# Patient Record
Sex: Female | Born: 1935 | Race: White | Hispanic: No | Marital: Married | Smoking: Former smoker
Health system: Southern US, Community
[De-identification: ages and names within clinical notes are randomized; demographics above are authoritative.]

## PROBLEM LIST (undated history)

## (undated) DIAGNOSIS — E785 Hyperlipidemia, unspecified: Secondary | ICD-10-CM

## (undated) DIAGNOSIS — N183 Chronic kidney disease, stage 3 unspecified: Secondary | ICD-10-CM

## (undated) DIAGNOSIS — I10 Essential (primary) hypertension: Secondary | ICD-10-CM

## (undated) DIAGNOSIS — R05 Cough: Secondary | ICD-10-CM

## (undated) DIAGNOSIS — R053 Chronic cough: Secondary | ICD-10-CM

## (undated) DIAGNOSIS — K220 Achalasia of cardia: Secondary | ICD-10-CM

## (undated) HISTORY — DX: Achalasia of cardia: K22.0

## (undated) HISTORY — DX: Essential (primary) hypertension: I10

## (undated) HISTORY — DX: Hyperlipidemia, unspecified: E78.5

## (undated) HISTORY — PX: ESOPHAGUS SURGERY: SHX626

---

## 2010-05-27 DIAGNOSIS — I10 Essential (primary) hypertension: Secondary | ICD-10-CM | POA: Insufficient documentation

## 2010-05-27 DIAGNOSIS — E7800 Pure hypercholesterolemia, unspecified: Secondary | ICD-10-CM

## 2010-05-30 ENCOUNTER — Ambulatory Visit: Payer: Self-pay | Admitting: Pulmonary Disease

## 2010-05-30 DIAGNOSIS — R05 Cough: Secondary | ICD-10-CM

## 2010-05-30 DIAGNOSIS — J309 Allergic rhinitis, unspecified: Secondary | ICD-10-CM | POA: Insufficient documentation

## 2010-06-01 ENCOUNTER — Telehealth: Payer: Self-pay | Admitting: Pulmonary Disease

## 2010-06-01 DIAGNOSIS — R930 Abnormal findings on diagnostic imaging of skull and head, not elsewhere classified: Secondary | ICD-10-CM | POA: Insufficient documentation

## 2010-06-27 ENCOUNTER — Ambulatory Visit: Payer: Self-pay | Admitting: Cardiology

## 2010-06-30 ENCOUNTER — Encounter: Payer: Self-pay | Admitting: Pulmonary Disease

## 2010-07-04 ENCOUNTER — Telehealth (INDEPENDENT_AMBULATORY_CARE_PROVIDER_SITE_OTHER): Payer: Self-pay | Admitting: *Deleted

## 2010-08-01 ENCOUNTER — Ambulatory Visit (HOSPITAL_COMMUNITY): Admission: RE | Admit: 2010-08-01 | Discharge: 2010-08-01 | Payer: Self-pay | Admitting: Gastroenterology

## 2010-08-08 ENCOUNTER — Encounter: Admission: RE | Admit: 2010-08-08 | Discharge: 2010-08-08 | Payer: Self-pay | Admitting: Gastroenterology

## 2010-09-12 ENCOUNTER — Ambulatory Visit (HOSPITAL_COMMUNITY): Admission: RE | Admit: 2010-09-12 | Discharge: 2010-09-13 | Payer: Self-pay | Admitting: General Surgery

## 2011-01-10 NOTE — Assessment & Plan Note (Signed)
Summary: consult for chronic cough   Copy to:  Benedetto Goad Primary Provider/Referring Provider:  Benedetto Goad  CC:  Pulmonary Consult.  History of Present Illness: the pt is a 77y/o female who I have been asked to see for chronic cough.  She states this has been an ongoing issue for over one year, and a cough syrup is the only thing that helps it.  She has also been treated with abx and for GERD,with no relief.  She describes a classic globus sensation in her throat, and her husband states that she clears her throat constantly.  She has signficant postnasal drip, and hoarseness at times.  She denies any sinus pressure or h/o recurrent sinusitis.  She has occasional reflux symptoms, but not on a consistent basis.  Her cough is worse with prolonged conversation, but better with lozenges.  She has no h/o childhood asthma, and no consistent breathing issues.  She has not had a recent cxr.  Preventive Screening-Counseling & Management  Alcohol-Tobacco     Smoking Status: quit  Medications Prior to Update: 1)  Travatan Z 0.004 % Soln (Travoprost) .Marland Kitchen.. 1 Drop in Both Eyes At Bedtime 2)  Pravastatin Sodium 40 Mg Tabs (Pravastatin Sodium) .... Take 1 Tablet By Mouth Once A Day 3)  Diltiazem Hcl Coated Beads 240 Mg Xr24h-Cap (Diltiazem Hcl Coated Beads) .... Take 1 Tablet By Mouth Once A Day 4)  Diovan Hct 160-25 Mg Tabs (Valsartan-Hydrochlorothiazide) .... Take 1 Tablet By Mouth Once A Day  Current Medications (verified): 1)  Travatan Z 0.004 % Soln (Travoprost) .Marland Kitchen.. 1 Drop in Both Eyes At Bedtime 2)  Pravastatin Sodium 40 Mg Tabs (Pravastatin Sodium) .... Take 1 Tablet By Mouth Once A Day 3)  Diltiazem Hcl Coated Beads 240 Mg Xr24h-Cap (Diltiazem Hcl Coated Beads) .... Take 1 Tablet By Mouth Once A Day 4)  Diovan Hct 160-25 Mg Tabs (Valsartan-Hydrochlorothiazide) .... Take 1 Tablet By Mouth Once A Day 5)  Hydrocodone-Homatropine 5-1.5 Mg/74ml Syrp (Hydrocodone-Homatropine) .... Use As Needed For  Cough  Allergies (verified): 1)  ! Doxycycline 2)  ! Lipitor 3)  ! * Metoprolol 4)  ! Niacin 5)  ! Penicillin 6)  ! Sulfa  Past History:  Past Medical History:  ALLERGIC RHINITIS (ICD-477.9) HYPERTENSION (ICD-401.9) HYPERCHOLESTEROLEMIA (ICD-272.0)    Past Surgical History: hysterectomy 1984  Family History: Reviewed history and no changes required. heart disease: father, brother cancer: sister (bone)  Social History: Reviewed history and no changes required. Patient states former smoker.  started at age 42.  1 ppd.  quit 2008. pt is married to [place] and lives with him. Pt has children. pt is retired. prev worked in [employer].  Smoking Status:  quit  Review of Systems       The patient complains of shortness of breath with activity, productive cough, and sneezing.  The patient denies shortness of breath at rest, non-productive cough, coughing up blood, chest pain, irregular heartbeats, acid heartburn, indigestion, loss of appetite, weight change, abdominal pain, difficulty swallowing, sore throat, tooth/dental problems, headaches, nasal congestion/difficulty breathing through nose, itching, ear ache, anxiety, depression, hand/feet swelling, joint stiffness or pain, rash, change in color of mucus, and fever.    Vital Signs:  Patient profile:   77 year old female Height:      63 inches Weight:      165 pounds BMI:     29.33 O2 Sat:      97 % on Room air Temp:     98.1 degrees  F oral Pulse rate:   69 / minute BP sitting:   138 / 82  (left arm) Cuff size:   regular  Vitals Entered By: Arman Filter LPN (May 30, 2010 11:08 AM)  O2 Flow:  Room air CC: Pulmonary Consult Comments Medications reviewed with patient Arman Filter LPN  May 30, 2010 11:16 AM    Physical Exam  General:  ow female in nad Eyes:  PERRLA and EOMI.   Nose:  patent without discharge no purulence noted mild inflammatory changes. Mouth:  clear, no exudates Neck:  no jvd, tmg, LN Lungs:   totally clear to auscultation Heart:  rrr, no mrg Abdomen:  soft and nontender, bs+ Extremities:  no edema noted, +varicosities no cyanosis  Neurologic:  alert and oriented, moves all 4.   Impression & Recommendations:  Problem # 1:  COUGH, CHRONIC (ICD-786.2) the pt has a chronic cough for over one year duration that I suspect is upper airway in origin.  She has a classic globus sensation, hoarseness, and chronic throat clearing.  The most common causes are postnasal drip, LPR, and a cyclical cough mechanism.  The pt may have all 3.  Would like to work on behavioral therapies to prevent further propagation, and also to treat aggressively for AR and reflux.  Will also check cxr for completeness given her h/o tobacco abuse.  Medications Added to Medication List This Visit: 1)  Hydrocodone-homatropine 5-1.5 Mg/9ml Syrp (Hydrocodone-homatropine) .... Use as needed for cough  Other Orders: Consultation Level IV (36644) T-2 View CXR (71020TC)  Patient Instructions: 1)  continue your cough medicine everynight, and take as needed during the day as well.  The goal is to NOT COUGH. 2)  use hard candy to bathe back of throat all during the day 3)  no throat clearing,  just drink water. 4)  take dexilant one each am for reflux for the next 2 weeks 5)  take chlorpheniramine 8mg  over the counter each night for next 2 weeks 6)  take veramyst 2 sprays each nostril each am. 7)  limit voice use as much as possible. 8)  will check cxr today, and call you with results. 9)  call me in 2 weeks to let me know how things are going.

## 2011-01-10 NOTE — Progress Notes (Signed)
Summary: NEEDS TO SCHEDULE CT  Phone Note Call from Patient Call back at Home Phone [phone]   Caller: Patient Call For: [facility] Summary of Call: WANTS TO SCHEDULE HER CHEST CT AROUND THE 18 TH OF JULY 19, OR 20TH Initial call taken by: Lacinda Axon,  June 01, 2010 12:47 PM  Follow-up for Phone Call        Dr. Shelle Iron: please advise of specifics for pt's chest ct.  thanks! Boone Master CNA/MA  June 01, 2010 2:55 PM   Additional Follow-up for Phone Call Additional follow up Details #1::        will send order to [facility] Additional Follow-up by: Barbaraann Share MD,  June 01, 2010 5:25 PM  New Problems: ABNORMAL CHEST XRAY (ICD-793.1)   New Problems: ABNORMAL CHEST XRAY (ICD-793.1)

## 2011-01-10 NOTE — Letter (Signed)
Summary: Response to Benedetto Goad MD  Response to Benedetto Goad MD   Imported By: Lester Turkey 07/04/2010 08:41:18  _____________________________________________________________________  External Attachment:    Type:   Image     Comment:   External Document

## 2011-01-10 NOTE — Progress Notes (Signed)
Summary: returning call  Phone Note Call from Patient Call back at Home Phone [phone]   Caller: Patient Call For: clance Reason for Call: Talk to Nurse Summary of Call: returning call to Megan re: CT results. Initial call taken by: Eugene Gavia,  July 04, 2010 8:46 AM  Follow-up for Phone Call        called and spoke with pt.  pt aware of ct results.  Arman Filter LPN  July 04, 2010 2:31 PM

## 2011-02-23 LAB — CBC
HCT: 30.9 % — ABNORMAL LOW (ref 36.0–46.0)
HCT: 33.1 % — ABNORMAL LOW (ref 36.0–46.0)
Hemoglobin: 10.5 g/dL — ABNORMAL LOW (ref 12.0–15.0)
Hemoglobin: 11.6 g/dL — ABNORMAL LOW (ref 12.0–15.0)
MCH: 31.1 pg (ref 26.0–34.0)
MCH: 31.6 pg (ref 26.0–34.0)
MCHC: 34 g/dL (ref 30.0–36.0)
MCHC: 35 g/dL (ref 30.0–36.0)
MCV: 91.5 fL (ref 78.0–100.0)
MCV: 90.4 fL (ref 78.0–100.0)
Platelets: 211 10*3/uL (ref 150–400)
Platelets: 276 10*3/uL (ref 150–400)
RBC: 3.38 MIL/uL — ABNORMAL LOW (ref 3.87–5.11)
RBC: 3.66 MIL/uL — ABNORMAL LOW (ref 3.87–5.11)
RDW: 12.4 % (ref 11.5–15.5)
RDW: 13 % (ref 11.5–15.5)
WBC: 10.6 10*3/uL — ABNORMAL HIGH (ref 4.0–10.5)
WBC: 6.4 10*3/uL (ref 4.0–10.5)

## 2011-02-23 LAB — BASIC METABOLIC PANEL
BUN: 9 mg/dL (ref 6–23)
CO2: 25 mEq/L (ref 19–32)
Calcium: 8.7 mg/dL (ref 8.4–10.5)
Chloride: 99 mEq/L (ref 96–112)
Creatinine, Ser: 1.19 mg/dL (ref 0.4–1.2)
GFR calc Af Amer: 54 mL/min — ABNORMAL LOW (ref >60)
GFR calc non Af Amer: 44 mL/min — ABNORMAL LOW (ref >60)
Glucose, Bld: 147 mg/dL — ABNORMAL HIGH (ref 70–99)
Potassium: 3.9 mEq/L (ref 3.5–5.1)
Sodium: 130 mEq/L — ABNORMAL LOW (ref 135–145)

## 2011-02-23 LAB — ABO/RH: ABO/RH(D): O NEG

## 2011-02-23 LAB — SURGICAL PCR SCREEN
MRSA, PCR: NEGATIVE
Staphylococcus aureus: NEGATIVE

## 2011-02-23 LAB — DIFFERENTIAL
Basophils Absolute: 0 10*3/uL (ref 0.0–0.1)
Basophils Relative: 1 % (ref 0–1)
Eosinophils Absolute: 0 10*3/uL (ref 0.0–0.7)
Eosinophils Relative: 1 % (ref 0–5)
Lymphocytes Relative: 24 % (ref 12–46)
Lymphs Abs: 1.5 10*3/uL (ref 0.7–4.0)
Monocytes Absolute: 0.6 10*3/uL (ref 0.1–1.0)
Monocytes Relative: 9 % (ref 3–12)
Neutro Abs: 4.2 10*3/uL (ref 1.7–7.7)
Neutrophils Relative %: 67 % (ref 43–77)

## 2011-02-23 LAB — COMPREHENSIVE METABOLIC PANEL
ALT: 12 U/L (ref 0–35)
AST: 20 U/L (ref 0–37)
Albumin: 4.1 g/dL (ref 3.5–5.2)
Alkaline Phosphatase: 109 U/L (ref 39–117)
BUN: 8 mg/dL (ref 6–23)
CO2: 29 mEq/L (ref 19–32)
Calcium: 9.2 mg/dL (ref 8.4–10.5)
Chloride: 96 mEq/L (ref 96–112)
Creatinine, Ser: 1.34 mg/dL — ABNORMAL HIGH (ref 0.4–1.2)
GFR calc Af Amer: 47 mL/min — ABNORMAL LOW (ref >60)
GFR calc non Af Amer: 39 mL/min — ABNORMAL LOW (ref >60)
Glucose, Bld: 95 mg/dL (ref 70–99)
Potassium: 3.7 mEq/L (ref 3.5–5.1)
Sodium: 132 mEq/L — ABNORMAL LOW (ref 135–145)
Total Bilirubin: 0.9 mg/dL (ref 0.3–1.2)
Total Protein: 6.9 g/dL (ref 6.0–8.3)

## 2011-02-23 LAB — PROTIME-INR
INR: 0.94 (ref 0.00–1.49)
Prothrombin Time: 12.8 seconds (ref 11.6–15.2)

## 2011-02-23 LAB — TYPE AND SCREEN
ABO/RH(D): O NEG
Antibody Screen: NEGATIVE

## 2011-03-06 HISTORY — PX: ABDOMINAL HYSTERECTOMY: SHX81

## 2011-03-27 HISTORY — PX: CATARACT EXTRACTION: SUR2

## 2011-08-21 ENCOUNTER — Other Ambulatory Visit: Payer: Self-pay | Admitting: Orthopedic Surgery

## 2011-08-21 DIAGNOSIS — M545 Low back pain: Secondary | ICD-10-CM

## 2011-08-21 DIAGNOSIS — M25552 Pain in left hip: Secondary | ICD-10-CM

## 2011-08-28 ENCOUNTER — Ambulatory Visit
Admission: RE | Admit: 2011-08-28 | Discharge: 2011-08-28 | Disposition: A | Discharge status: Home / Self Care | Payer: Medicare Other | Source: Ambulatory Visit | Attending: Orthopedic Surgery | Admitting: Orthopedic Surgery

## 2011-08-28 DIAGNOSIS — M545 Low back pain: Secondary | ICD-10-CM

## 2011-08-28 DIAGNOSIS — M25552 Pain in left hip: Secondary | ICD-10-CM

## 2011-11-27 ENCOUNTER — Encounter (INDEPENDENT_AMBULATORY_CARE_PROVIDER_SITE_OTHER): Payer: Self-pay

## 2011-11-28 ENCOUNTER — Encounter (INDEPENDENT_AMBULATORY_CARE_PROVIDER_SITE_OTHER): Payer: Self-pay | Admitting: General Surgery

## 2011-11-28 ENCOUNTER — Ambulatory Visit (INDEPENDENT_AMBULATORY_CARE_PROVIDER_SITE_OTHER): Payer: Medicare Other | Admitting: General Surgery

## 2011-11-28 DIAGNOSIS — R1310 Dysphagia, unspecified: Secondary | ICD-10-CM | POA: Insufficient documentation

## 2011-11-28 NOTE — Patient Instructions (Signed)
Avoid bread products.  Take one Prilosec pin everyday at 5 pm.  We will call you with your test results.

## 2011-11-28 NOTE — Progress Notes (Signed)
Patient ID: Jean Lam, female   DOB: Mar 15, 1934, 77 y.o.   MRN: [id number]  Chief Complaint  Patient presents with  . Follow-up    LTF re ecal pt having feeling of food getting stuckl    HPI Jean Lam is a 77 y.o. female.   HPI  She is status post laparoscopic Heller myotomy for achalasia September 12, 2010. Ever since her diet was liberated she's been having some trouble swallowing bread products at times. There is no other specific foods that she has trouble with.  She does make herself regurgitate when this happens. She has occasional heartburn. She does have a cough.  Past Medical History  Diagnosis Date  . Hyperlipidemia, HTN, Achalasia, Pneumonia     Past Surgical History  Procedure Date  . Abdominal hysterectomy 03/06/11  . Cataract extraction 03/27/2011    Family History  Problem Relation Age of Onset  . Heart disease Father     Social History History  Substance Use Topics  . Smoking status: Former Games developer  . Smokeless tobacco: Not on file  . Alcohol Use: No    Allergies  Allergen Reactions  . Atorvastatin   . Doxycycline   . Metoprolol   . Niacin   . Penicillins   . Sulfonamide Derivatives     Current Outpatient Prescriptions  Medication Sig Dispense Refill  . diltiazem (CARDIZEM CD) 240 MG 24 hr capsule daily.      . meloxicam (MOBIC) 7.5 MG tablet daily.      . pravastatin (PRAVACHOL) 40 MG tablet daily.      Marland Kitchen spironolactone-hydrochlorothiazide (ALDACTAZIDE) 25-25 MG per tablet daily.      . TRAVATAN Z 0.004 % SOLN ophthalmic solution At bedtime.      . triamcinolone cream (KENALOG) 0.1 %         Review of Systems Review of Systems  Constitutional: Negative for fever and unexpected weight change.  Respiratory: Positive for cough.   Gastrointestinal:       No abd pain.  Occasional diarrhea.    Blood pressure 148/96, pulse 80, temperature 96.9 F (36.1 C), temperature source Temporal, resp. rate 20, height 5' 0.5" (1.537 m), weight 166 lb  6.4 oz (75.479 kg).  Physical Exam Physical Exam  Constitutional: No distress.       Overweight.   HENT:  Head: Normocephalic and atraumatic.  Mouth/Throat: Oropharynx is clear and moist.  Abdominal: Soft. She exhibits no distension and no mass. There is no tenderness.       Well healed scars.    Data Reviewed Operative Note.  Assessment    Dysphagia to bread products which has basically be occuring ever since her diet was advanced to as tolerated.  Also, has a cough- ? Reflux.    Plan    Avoid bread products.  UGI. Prilosec daily.  Will call her with the UGI results.       [name] J 11/28/2011, 10:44 AM

## 2011-12-14 ENCOUNTER — Ambulatory Visit (HOSPITAL_COMMUNITY)
Admission: RE | Admit: 2011-12-14 | Discharge: 2011-12-14 | Disposition: A | Discharge status: Home / Self Care | Payer: Medicare Other | Source: Ambulatory Visit | Attending: General Surgery | Admitting: General Surgery

## 2011-12-14 DIAGNOSIS — K449 Diaphragmatic hernia without obstruction or gangrene: Secondary | ICD-10-CM | POA: Insufficient documentation

## 2011-12-14 DIAGNOSIS — R1310 Dysphagia, unspecified: Secondary | ICD-10-CM | POA: Insufficient documentation

## 2011-12-14 DIAGNOSIS — R079 Chest pain, unspecified: Secondary | ICD-10-CM | POA: Insufficient documentation

## 2011-12-21 ENCOUNTER — Telehealth (INDEPENDENT_AMBULATORY_CARE_PROVIDER_SITE_OTHER): Payer: Self-pay | Admitting: General Surgery

## 2011-12-21 NOTE — Telephone Encounter (Signed)
I spoke with Mrs. Hinger regarding her upper GI study. It demonstrates significant esophageal motility disorder but contrast does pass into the stomach and a barium tablet does pass through although it is delayed. A small hiatal hernia is noted. There was no evidence of reflux. There is nothing here to suggest an etiology for cough. We discussed the fact that certain foods may still be difficult to swallow such as bread products and she should probably avoid those. Overall her swallowing is still better than it was preoperatively.

## 2012-03-06 ENCOUNTER — Institutional Professional Consult (permissible substitution): Payer: Medicare Other | Admitting: Pulmonary Disease

## 2013-03-24 ENCOUNTER — Encounter: Payer: Self-pay | Admitting: Pulmonary Disease

## 2013-03-24 ENCOUNTER — Ambulatory Visit (INDEPENDENT_AMBULATORY_CARE_PROVIDER_SITE_OTHER)
Admission: RE | Admit: 2013-03-24 | Discharge: 2013-03-24 | Disposition: A | Discharge status: Home / Self Care | Payer: Medicare Other | Source: Ambulatory Visit | Attending: Pulmonary Disease | Admitting: Pulmonary Disease

## 2013-03-24 ENCOUNTER — Ambulatory Visit (INDEPENDENT_AMBULATORY_CARE_PROVIDER_SITE_OTHER): Payer: Medicare Other | Admitting: Pulmonary Disease

## 2013-03-24 VITALS — BP 130/76 | HR 74 | Temp 98.3°F | Ht 63.0 in | Wt 165.0 lb

## 2013-03-24 DIAGNOSIS — R05 Cough: Secondary | ICD-10-CM

## 2013-03-24 MED ORDER — BENZONATATE 100 MG PO CAPS: 200.0000 mg | ORAL_CAPSULE | Freq: Four times a day (QID) | ORAL | Status: DC | PRN

## 2013-03-24 MED ORDER — OMEPRAZOLE 40 MG PO CPDR: 40.0000 mg | DELAYED_RELEASE_CAPSULE | Freq: Every day | ORAL | Status: DC

## 2013-03-24 NOTE — Assessment & Plan Note (Signed)
The patient has a chronic cough that is clearly coming from her upper airway.  She describes a tickle as well as chronic hoarseness.  She does have a long-standing history of smoking, and I cannot exclude the possibility of COPD.  We do need to do a followup chest x-ray to make sure there is no pulmonary pathology.  She has a history of achalasia, and has persistent dysphagia and regurgitation despite having a myotomy.  I suspect reflux is the culprit for her chronic cough, and we'll start her on a trial of b.i.d. Proton pump inhibitor.  She may need a trial of a promotility drug if this does not help.  Can also do PFT's for completeness if no improvement.

## 2013-03-24 NOTE — Progress Notes (Signed)
  Subjective:    Patient ID: Jean Lam, female    DOB: Aug 16, 1934, 79 y.o.   MRN: [id number]  HPI The patient comes in today for followup of chronic cough.  I have not seen her in almost 3 years, and she tells me that her cough never totally resolved.  The patient has a history of achalasia, and is status post myotomy in 2011.  She was noted in 2000 at 14 to have persistent dysphagia with regurgitation, as well as reflux symptoms.  She underwent an upper GI in 2012 which showed significant esophageal dysmotility.  She continues to have issues with dysphasia, regurgitation, and also reflux.  She is on no proton pump inhibitor at this time.  She feels the cough is coming from her throat area, and has a persistently hoarse voice.  She has significant postnasal drip, but has not had any purulence from her nasal passages.  She does have a long history of smoking, and has not had a recent chest x-ray.   Review of Systems  Constitutional: Negative for fever and unexpected weight change.  HENT: Positive for postnasal drip. Negative for ear pain, nosebleeds, congestion, sore throat, rhinorrhea, sneezing, trouble swallowing, dental problem and sinus pressure.   Eyes: Negative for redness and itching.  Respiratory: Positive for cough and shortness of breath. Negative for chest tightness and wheezing.        Patient reports a "tickle" in mid throat causing her to cough  Cardiovascular: Negative for palpitations and leg swelling.  Gastrointestinal: Negative for nausea and vomiting.  Genitourinary: Negative for dysuria.  Musculoskeletal: Negative for joint swelling.  Skin: Negative for rash.  Neurological: Negative for headaches.  Hematological: Does not bruise/bleed easily.  Psychiatric/Behavioral: Negative for dysphoric mood. The patient is not nervous/anxious.        Objective:   Physical Exam Well-developed female in no acute distress Nose without purulence or discharge noted Oropharynx  clear Neck without lymphadenopathy or thyromegaly Chest totally clear to auscultation, no wheezing Cardiac exam with regular rate and rhythm Lower extremities with minimal edema, no cyanosis Alert and oriented, moves all 4 extremities.       Assessment & Plan:

## 2013-03-24 NOTE — Patient Instructions (Addendum)
Will start on medication for acid reflux.  Take omeprazole 40mg  in am and pm for next 4 weeks. Get chlorpheniramine 4mg  otc, and take 2 at bedtime each night (have someone help you find at drugstore). Use hard candy (no mint or menthol) during the day to help cough Can use tessalon pearls 100mg  and take 2 every 6 hrs if needed for cough. Will check xray of chest today, and call with results.  followup with me in 4 weeks.

## 2013-04-22 ENCOUNTER — Ambulatory Visit (INDEPENDENT_AMBULATORY_CARE_PROVIDER_SITE_OTHER): Payer: Medicare Other | Admitting: Pulmonary Disease

## 2013-04-22 ENCOUNTER — Other Ambulatory Visit: Payer: Self-pay | Admitting: Pulmonary Disease

## 2013-04-22 ENCOUNTER — Encounter: Payer: Self-pay | Admitting: Pulmonary Disease

## 2013-04-22 VITALS — BP 142/82 | HR 67 | Temp 97.4°F | Ht 62.0 in | Wt 164.4 lb

## 2013-04-22 DIAGNOSIS — R05 Cough: Secondary | ICD-10-CM

## 2013-04-22 NOTE — Patient Instructions (Addendum)
Continue with chlorpheniramine 4mg  otc, 2 at bedtime and one at lunch as needed for your postnasal drip Stay on your acid reflux medication until you are seen by your GI doctor Will get you in to see Dr. Loreta Ave for further GI advice.  I see nothing from a lung standpoint to cause your cough, and I am concerned this is coming from reflux associated with your abnormal esophagus.

## 2013-04-22 NOTE — Assessment & Plan Note (Addendum)
The patient continues to have a chronic cough that I believe his upper airway in origin.  I really see no pulmonary explanation for her ongoing cough.  Her spirometry today is totally normal.  She does have some postnasal drip, and I've encouraged her to take her antihistamine on a consistent basis for this.  However, she also has severe esophageal dysmotility, and is at risk for significant reflux disease.  I will therefore send her to her gastroenterologist for further advice.

## 2013-04-22 NOTE — Progress Notes (Signed)
  Subjective:    Patient ID: Jean Lam, female    DOB: 1934/06/30, 79 y.o.   MRN: [id number]  HPI Patient comes in today for followup of her chronic cough.  At the last visit, her cough was felt to be secondary to reflux disease associated with her achalasia and esophageal dysmotility.  She was treated with a b.i.d. Proton pump inhibitor, and also asked to try chlorpheniramine for postnasal drip.  Also reviewed with her the behavioral therapies for cyclical coughing.  She comes in today where she feels her cough has not improved.  She continues to have a tickle in her throat, and thinks that she may have nasal drainage.  She denies sinus pressure, and has only clear nasal mucus.   Review of Systems  Constitutional: Negative for fever and unexpected weight change.  HENT: Positive for rhinorrhea, trouble swallowing ( choking with eating some foods. ) and postnasal drip. Negative for ear pain, nosebleeds, congestion, sore throat, sneezing, dental problem and sinus pressure.   Eyes: Negative for redness and itching.  Respiratory: Positive for cough and shortness of breath. Negative for chest tightness and wheezing.   Cardiovascular: Negative for palpitations and leg swelling.  Gastrointestinal: Positive for vomiting ( with coughing spells). Negative for nausea.  Genitourinary: Negative for dysuria.  Musculoskeletal: Negative for joint swelling.  Skin: Negative for rash.  Neurological: Negative for headaches.  Hematological: Does not bruise/bleed easily.  Psychiatric/Behavioral: Negative for dysphoric mood. The patient is not nervous/anxious.        Objective:   Physical Exam Overweight female in no acute distress Nose without purulence or discharge noted Oropharynx clear Neck without lymphadenopathy or thyromegaly Chest totally clear to auscultation, no wheezes or rhonchi Cardiac exam is regular rate and rhythm, 2/6 systolic murmur Lower extremities with mild ankle edema, no  cyanosis Alert and oriented, moves all 4 extremities.       Assessment & Plan:

## 2013-06-11 ENCOUNTER — Other Ambulatory Visit: Payer: Self-pay | Admitting: Gastroenterology

## 2013-06-11 DIAGNOSIS — R1310 Dysphagia, unspecified: Secondary | ICD-10-CM

## 2013-06-24 ENCOUNTER — Ambulatory Visit
Admission: RE | Admit: 2013-06-24 | Discharge: 2013-06-24 | Disposition: A | Discharge status: Home / Self Care | Payer: Medicare Other | Source: Ambulatory Visit | Attending: Gastroenterology | Admitting: Gastroenterology

## 2013-06-24 DIAGNOSIS — R1310 Dysphagia, unspecified: Secondary | ICD-10-CM

## 2015-09-30 ENCOUNTER — Encounter (HOSPITAL_COMMUNITY): Payer: Self-pay

## 2015-09-30 ENCOUNTER — Inpatient Hospital Stay (HOSPITAL_COMMUNITY)
Admission: EM | Admit: 2015-09-30 | Discharge: 2015-10-03 | DRG: 391 | Disposition: A | Discharge status: Home / Self Care | Payer: Medicare Other | Attending: Internal Medicine | Admitting: Internal Medicine

## 2015-09-30 ENCOUNTER — Emergency Department (HOSPITAL_COMMUNITY): Payer: Medicare Other

## 2015-09-30 DIAGNOSIS — R05 Cough: Secondary | ICD-10-CM | POA: Diagnosis present

## 2015-09-30 DIAGNOSIS — R197 Diarrhea, unspecified: Secondary | ICD-10-CM | POA: Diagnosis present

## 2015-09-30 DIAGNOSIS — Z8249 Family history of ischemic heart disease and other diseases of the circulatory system: Secondary | ICD-10-CM

## 2015-09-30 DIAGNOSIS — N183 Chronic kidney disease, stage 3 (moderate): Secondary | ICD-10-CM | POA: Diagnosis present

## 2015-09-30 DIAGNOSIS — R748 Abnormal levels of other serum enzymes: Secondary | ICD-10-CM | POA: Diagnosis present

## 2015-09-30 DIAGNOSIS — Z881 Allergy status to other antibiotic agents status: Secondary | ICD-10-CM

## 2015-09-30 DIAGNOSIS — E871 Hypo-osmolality and hyponatremia: Secondary | ICD-10-CM | POA: Diagnosis present

## 2015-09-30 DIAGNOSIS — E785 Hyperlipidemia, unspecified: Secondary | ICD-10-CM | POA: Diagnosis present

## 2015-09-30 DIAGNOSIS — N390 Urinary tract infection, site not specified: Secondary | ICD-10-CM | POA: Diagnosis present

## 2015-09-30 DIAGNOSIS — K219 Gastro-esophageal reflux disease without esophagitis: Secondary | ICD-10-CM | POA: Diagnosis present

## 2015-09-30 DIAGNOSIS — I10 Essential (primary) hypertension: Secondary | ICD-10-CM | POA: Diagnosis not present

## 2015-09-30 DIAGNOSIS — K529 Noninfective gastroenteritis and colitis, unspecified: Principal | ICD-10-CM | POA: Diagnosis present

## 2015-09-30 DIAGNOSIS — E7800 Pure hypercholesterolemia, unspecified: Secondary | ICD-10-CM | POA: Diagnosis present

## 2015-09-30 DIAGNOSIS — Z87891 Personal history of nicotine dependence: Secondary | ICD-10-CM | POA: Diagnosis not present

## 2015-09-30 DIAGNOSIS — I129 Hypertensive chronic kidney disease with stage 1 through stage 4 chronic kidney disease, or unspecified chronic kidney disease: Secondary | ICD-10-CM | POA: Diagnosis present

## 2015-09-30 DIAGNOSIS — Z6824 Body mass index (BMI) 24.0-24.9, adult: Secondary | ICD-10-CM | POA: Diagnosis not present

## 2015-09-30 DIAGNOSIS — E860 Dehydration: Secondary | ICD-10-CM | POA: Diagnosis present

## 2015-09-30 DIAGNOSIS — E440 Moderate protein-calorie malnutrition: Secondary | ICD-10-CM | POA: Insufficient documentation

## 2015-09-30 DIAGNOSIS — N179 Acute kidney failure, unspecified: Secondary | ICD-10-CM | POA: Diagnosis present

## 2015-09-30 DIAGNOSIS — E876 Hypokalemia: Secondary | ICD-10-CM | POA: Diagnosis present

## 2015-09-30 DIAGNOSIS — R059 Cough, unspecified: Secondary | ICD-10-CM | POA: Diagnosis present

## 2015-09-30 DIAGNOSIS — K859 Acute pancreatitis without necrosis or infection, unspecified: Secondary | ICD-10-CM | POA: Diagnosis present

## 2015-09-30 HISTORY — DX: Chronic kidney disease, stage 3 unspecified: N18.30

## 2015-09-30 HISTORY — DX: Chronic cough: R05.3

## 2015-09-30 HISTORY — DX: Chronic kidney disease, stage 3 (moderate): N18.3

## 2015-09-30 HISTORY — DX: Cough: R05

## 2015-09-30 LAB — URINALYSIS, ROUTINE W REFLEX MICROSCOPIC
GLUCOSE, UA: NEGATIVE mg/dL
Hgb urine dipstick: NEGATIVE
KETONES UR: 15 mg/dL — AB
NITRITE: POSITIVE — AB
PROTEIN: 30 mg/dL — AB
Specific Gravity, Urine: 1.026 (ref 1.005–1.030)
Urobilinogen, UA: 1 mg/dL (ref 0.0–1.0)
pH: 5 (ref 5.0–8.0)

## 2015-09-30 LAB — CBC
HCT: 34.9 % — ABNORMAL LOW (ref 36.0–46.0)
Hemoglobin: 12.2 g/dL (ref 12.0–15.0)
MCH: 32.3 pg (ref 26.0–34.0)
MCHC: 35 g/dL (ref 30.0–36.0)
MCV: 92.3 fL (ref 78.0–100.0)
PLATELETS: 326 10*3/uL (ref 150–400)
RBC: 3.78 MIL/uL — AB (ref 3.87–5.11)
RDW: 12.9 % (ref 11.5–15.5)
WBC: 27.9 10*3/uL — AB (ref 4.0–10.5)

## 2015-09-30 LAB — COMPREHENSIVE METABOLIC PANEL
ALK PHOS: 89 U/L (ref 38–126)
ALT: 13 U/L — AB (ref 14–54)
AST: 29 U/L (ref 15–41)
Albumin: 3.6 g/dL (ref 3.5–5.0)
Anion gap: 15 (ref 5–15)
BILIRUBIN TOTAL: 1.3 mg/dL — AB (ref 0.3–1.2)
BUN: 25 mg/dL — AB (ref 6–20)
CALCIUM: 8.6 mg/dL — AB (ref 8.9–10.3)
CO2: 22 mmol/L (ref 22–32)
CREATININE: 2.78 mg/dL — AB (ref 0.44–1.00)
Chloride: 89 mmol/L — ABNORMAL LOW (ref 101–111)
GFR calc non Af Amer: 15 mL/min — ABNORMAL LOW (ref >60)
GFR, EST AFRICAN AMERICAN: 18 mL/min — AB (ref >60)
Glucose, Bld: 116 mg/dL — ABNORMAL HIGH (ref 65–99)
Potassium: 3.3 mmol/L — ABNORMAL LOW (ref 3.5–5.1)
Sodium: 126 mmol/L — ABNORMAL LOW (ref 135–145)
TOTAL PROTEIN: 7.2 g/dL (ref 6.5–8.1)

## 2015-09-30 LAB — URINE MICROSCOPIC-ADD ON

## 2015-09-30 LAB — LIPASE, BLOOD: LIPASE: 131 U/L — AB (ref 11–51)

## 2015-09-30 MED ORDER — METRONIDAZOLE IN NACL 5-0.79 MG/ML-% IV SOLN
500.0000 mg | Freq: Once | INTRAVENOUS | Status: AC
  Administered 2015-09-30: 500 mg via INTRAVENOUS
  Filled 2015-09-30: qty 100

## 2015-09-30 MED ORDER — DILTIAZEM HCL ER COATED BEADS 240 MG PO CP24
240.0000 mg | ORAL_CAPSULE | Freq: Every day | ORAL | Status: DC
  Administered 2015-10-01 – 2015-10-03 (×3): 240 mg via ORAL
  Filled 2015-09-30 (×3): qty 1

## 2015-09-30 MED ORDER — SODIUM CHLORIDE 0.9 % IV BOLUS (SEPSIS)
1000.0000 mL | Freq: Once | INTRAVENOUS | Status: AC
  Administered 2015-09-30: 1000 mL via INTRAVENOUS

## 2015-09-30 MED ORDER — VITAMIN D 1000 UNITS PO TABS
2000.0000 [IU] | ORAL_TABLET | Freq: Every day | ORAL | Status: DC
  Administered 2015-10-01 – 2015-10-03 (×3): 2000 [IU] via ORAL
  Filled 2015-09-30 (×3): qty 2

## 2015-09-30 MED ORDER — FAMOTIDINE IN NACL 20-0.9 MG/50ML-% IV SOLN
20.0000 mg | Freq: Two times a day (BID) | INTRAVENOUS | Status: DC
  Administered 2015-10-01 (×3): 20 mg via INTRAVENOUS
  Filled 2015-09-30 (×4): qty 50

## 2015-09-30 MED ORDER — POTASSIUM CHLORIDE 20 MEQ/15ML (10%) PO SOLN
20.0000 meq | Freq: Once | ORAL | Status: AC
  Administered 2015-10-01: 20 meq via ORAL
  Filled 2015-09-30: qty 15

## 2015-09-30 MED ORDER — SODIUM CHLORIDE 0.9 % IJ SOLN
3.0000 mL | Freq: Two times a day (BID) | INTRAMUSCULAR | Status: DC
  Administered 2015-10-01 – 2015-10-02 (×2): 3 mL via INTRAVENOUS

## 2015-09-30 MED ORDER — VITAMIN B-12 1000 MCG PO TABS
1000.0000 ug | ORAL_TABLET | Freq: Every day | ORAL | Status: DC
Start: 2015-10-01 — End: 2015-10-03
  Administered 2015-10-01 – 2015-10-03 (×3): 1000 ug via ORAL
  Filled 2015-09-30 (×3): qty 1

## 2015-09-30 MED ORDER — ONDANSETRON HCL 4 MG/2ML IJ SOLN: 4.0000 mg | Freq: Three times a day (TID) | INTRAMUSCULAR | Status: DC | PRN

## 2015-09-30 MED ORDER — BENZONATATE 100 MG PO CAPS: 200.0000 mg | ORAL_CAPSULE | Freq: Four times a day (QID) | ORAL | Status: DC | PRN

## 2015-09-30 MED ORDER — METRONIDAZOLE IN NACL 5-0.79 MG/ML-% IV SOLN
500.0000 mg | Freq: Three times a day (TID) | INTRAVENOUS | Status: DC
  Administered 2015-10-01 – 2015-10-02 (×4): 500 mg via INTRAVENOUS
  Filled 2015-09-30 (×5): qty 100

## 2015-09-30 MED ORDER — SODIUM CHLORIDE 0.9 % IV SOLN
INTRAVENOUS | Status: DC
  Administered 2015-10-01 – 2015-10-02 (×4): via INTRAVENOUS

## 2015-09-30 MED ORDER — DEXTROSE 5 % IV SOLN
2.0000 g | INTRAVENOUS | Status: AC
  Administered 2015-10-01: 2 g via INTRAVENOUS
  Filled 2015-09-30: qty 2

## 2015-09-30 MED ORDER — HEPARIN SODIUM (PORCINE) 5000 UNIT/ML IJ SOLN
5000.0000 [IU] | Freq: Three times a day (TID) | INTRAMUSCULAR | Status: DC
  Administered 2015-10-01: 5000 [IU] via SUBCUTANEOUS
  Filled 2015-09-30 (×4): qty 1

## 2015-09-30 MED ORDER — HYDRALAZINE HCL 20 MG/ML IJ SOLN: 5.0000 mg | INTRAMUSCULAR | Status: DC | PRN

## 2015-09-30 MED ORDER — LATANOPROST 0.005 % OP SOLN
1.0000 [drp] | Freq: Every day | OPHTHALMIC | Status: DC
  Administered 2015-10-01 – 2015-10-02 (×3): 1 [drp] via OPHTHALMIC
  Filled 2015-09-30 (×2): qty 2.5

## 2015-09-30 NOTE — ED Notes (Signed)
Pt aware that a urine sample is needed. Pt unable to urinate at this time. 

## 2015-09-30 NOTE — H&P (Signed)
[facility] History and Physical  Jean Lam ZOX:[id number] DOB: 1934/11/04 DOA: 09/30/2015  Referring physician: ED physician PCP: Pamelia Hoit, MD  Specialists:   Chief Complaint: Diarrhea  HPI: Jean Lam is a 81 y.o. female with PMH of hypertension, hyperlipidemia, GERD, achalasia, CKD-III, chronic cough, who presents with diarrhea.  Patient reports that she has been having intermittent diarrhea for about one week. It is associate with nausea, but not vomiting. She has mild intermittent and diffused abdominal pain which does not bother her much. She has generalized weakness and decreased appetite. She reports that she had 5 bowel movements with loose stool today. Denies recent antibiotics use. Patient does not have fever, chills, chest pain, shortness of breath, symptoms of UTI, unilateral weakness. She reports that she has chronic cough which has not changed.  In ED, patient was found to have WBC 27.9, lipase 131, posterior urinalysis with large amount for leukocytes, temperature normal, potassium is 3.3, sodium 126, AKI. CT abdomen/pelvis showed nonspecific stranding surrounding the descending colon, the etiology of which is not depicted on this examination though likely indicative of infectious or inflammatory enteritis, no definable/drainable fluid collection, no evidence of enteric obstruction; colonic diverticulosis without evidence of diverticulitis; cholelithiasis without evidence of cholecystitis.  Where does patient live?   At home    Can patient participate in ADLs?  Some   Review of Systems:   General: no fevers, chills, no changes in body weight, has poor appetite, has fatigue HEENT: no blurry vision, hearing changes or sore throat Pulm: no dyspnea, has coughing, no wheezing CV: no chest pain, palpitations Abd: has nausea, abdominal pain, diarrhea, no vomiting and constipation GU: no dysuria, burning on urination, increased urinary frequency,  hematuria  Ext: no leg edema Neuro: no unilateral weakness, numbness, or tingling, no vision change or hearing loss Skin: no rash MSK: No muscle spasm, no deformity, no limitation of range of movement in spin Heme: No easy bruising.  Travel history: No recent long distant travel.  Allergy:  Allergies  Allergen Reactions  . Atorvastatin   . Avelox [Moxifloxacin Hcl In Nacl]   . Diovan [Valsartan]   . Doxycycline   . Levaquin [Levofloxacin In D5w]     Rash  . Metoprolol   . Niacin   . Oxycodone     Upset stomach  . Penicillins   . Prednisone     redness  . Sulfonamide Derivatives     Past Medical History  Diagnosis Date  . Hyperlipidemia   . Achalasia   . Hypertension   . CKD (chronic kidney disease), stage III   . Chronic cough     Past Surgical History  Procedure Laterality Date  . Abdominal hysterectomy  03/06/11  . Cataract extraction  03/27/2011  . Esophagus surgery      Social History:  reports that she quit smoking about 7 years ago. Her smoking use included Cigarettes. She has a 50 pack-year smoking history. She does not have any smokeless tobacco history on file. She reports that she does not drink alcohol or use illicit drugs.  Family History:  Family History  Problem Relation Age of Onset  . Heart disease Father      Prior to Admission medications   Medication Sig Start Date End Date Taking? Authorizing Provider  Cholecalciferol (VITAMIN D3) 2000 UNITS capsule Take 2,000 Units by mouth daily.   Yes Historical Provider, MD  diltiazem (CARDIZEM CD) 240 MG 24 hr capsule Take 240 mg by mouth daily.  11/26/11  Yes Historical Provider, MD  latanoprost (XALATAN) 0.005 % ophthalmic solution Place 1 drop into both eyes at bedtime.   Yes Historical Provider, MD  spironolactone-hydrochlorothiazide (ALDACTAZIDE) 25-25 MG per tablet Take 1 tablet by mouth daily.  10/30/11  Yes Historical Provider, MD  vitamin B-12 (CYANOCOBALAMIN) 1000 MCG tablet Take 1,000 mcg by  mouth daily.   Yes Historical Provider, MD  benzonatate (TESSALON) 100 MG capsule Take 2 capsules (200 mg total) by mouth every 6 (six) hours as needed for cough. Patient not taking: Reported on 09/30/2015 03/24/13   Barbaraann Share, MD  omeprazole (PRILOSEC) 40 MG capsule TAKE ONE CAPSULE BY MOUTH EVERY DAY Patient not taking: Reported on 09/30/2015 04/22/13   Barbaraann Share, MD    Physical Exam: Filed Vitals:   09/30/15 2105 09/30/15 2230 09/30/15 2320 10/01/15 0103  BP: 142/53 133/64 151/70   Pulse: 79 75    Temp:   98.2 F (36.8 C)   TempSrc:   Oral   Resp: 19 19 18    Height:    5\' 2"  (1.575 m)  Weight:    61.236 kg (135 lb)  SpO2: 96% 95% 98%    General: Not in acute distress HEENT:       Eyes: PERRL, EOMI, no scleral icterus.       ENT: No discharge from the ears and nose, no pharynx injection, no tonsillar enlargement.        Neck: No JVD, no bruit, no mass felt. Heme: No neck lymph node enlargement. Cardiac: S1/S2, RRR, No murmurs, No gallops or rubs. Pulm: No rales, wheezing, rhonchi or rubs. Abd: Soft, nondistended, minimally tender diffusely, no rebound pain, no organomegaly, BS present. Ext: No pitting leg edema bilaterally. 2+DP/PT pulse bilaterally. Musculoskeletal: No joint deformities, No joint redness or warmth, no limitation of ROM in spin. Skin: No rashes.  Neuro: Alert, oriented X3, cranial nerves II-XII grossly intact, muscle strength 5/5 in all extremities, sensation to light touch intact.  Psych: Patient is not psychotic, no suicidal or hemocidal ideation.  Labs on Admission:  Basic Metabolic Panel:  Recent Labs Lab 09/30/15 1713  NA 126*  K 3.3*  CL 89*  CO2 22  GLUCOSE 116*  BUN 25*  CREATININE 2.78*  CALCIUM 8.6*   Liver Function Tests:  Recent Labs Lab 09/30/15 1713  AST 29  ALT 13*  ALKPHOS 89  BILITOT 1.3*  PROT 7.2  ALBUMIN 3.6    Recent Labs Lab 09/30/15 1713  LIPASE 131*   No results for input(s): AMMONIA in the last 168  hours. CBC:  Recent Labs Lab 09/30/15 1713 10/01/15 0222  WBC 27.9* 22.5*  HGB 12.2 9.9*  HCT 34.9* 28.0*  MCV 92.3 92.4  PLT 326 256   Cardiac Enzymes: No results for input(s): CKTOTAL, CKMB, CKMBINDEX, TROPONINI in the last 168 hours.  BNP (last 3 results) No results for input(s): BNP in the last 8760 hours.  ProBNP (last 3 results) No results for input(s): PROBNP in the last 8760 hours.  CBG: No results for input(s): GLUCAP in the last 168 hours.  Radiological Exams on Admission: Ct Abdomen Pelvis Wo Contrast  09/30/2015  CLINICAL DATA:  Lack of appetite for 1 month. Intermittent diarrhea. EXAM: CT ABDOMEN AND PELVIS WITHOUT CONTRAST TECHNIQUE: Multidetector CT imaging of the abdomen and pelvis was performed following the standard protocol without IV contrast. COMPARISON:  None. FINDINGS: The lack of intravenous contrast limits the ability to evaluate solid abdominal organs. Normal hepatic contour. There  are several punctate layering radiopaque gallstones within otherwise normal appearing gallbladder. No ascites. Note is made of a slightly exaggerated horizontal lie of the right kidney. The left kidney appears mildly atrophic in comparison to the right. No renal stones. No renal stones are seen along the expected course of either ureter or the urinary bladder. Normal noncontrast appearance of the urinary bladder given underdistention. Several phleboliths are seen within the lower pelvis bilaterally. No urinary obstruction or perinephric stranding. There is mild thickening of the bilateral adrenal glands without discrete nodule. Normal noncontrast appearance of the pancreas and spleen. Incidental is made of several small splenules. There is ill-defined stranding surrounding the descending colon (representative images 20 and 21; coronal image 61, series 3), the etiology of which is not depicted on this examination. Scattered colonic diverticulosis seen within the sigmoid colon  (representative image 57, series 2). A small amount of fluid is seen within the pelvic cul-de-sac. No definable/drainable fluid collection. Presumed postsurgical change involving the GE junction. No evidence of enteric obstruction. No pneumoperitoneum, pneumatosis or portal venous gas. Postsurgical change involving the GE junction. Moderate amount of eccentric calcified atherosclerotic plaque within a normal caliber abdominal aorta. No bulky retroperitoneal, mesenteric, pelvic or inguinal lymphadenopathy. Post hysterectomy.  No discrete adnexal lesion. Limited visualization of lower thorax demonstrates minimal subsegmental atelectasis within the image caudal segment of the lingula. No focal airspace opacities. Normal heart size.  No pericardial effusion. No acute or aggressive osseous abnormalities. Moderate compression deformity involving the anterior aspect of the superior endplate of the L1 vertebral body. Moderate severe multilevel lumbar spine DDD, worse at L3-L4 and L4-L5 with disc space height loss, endplate irregularity and small posteriorly directed disc osteophyte complexes at these locations. Mild presumably degenerative rotatory scoliotic curvature of the lumbar spine. Regional soft tissues appear normal. IMPRESSION: 1. Nonspecific stranding surrounding the descending colon, the etiology of which is not depicted on this examination though likely indicative of infectious or inflammatory enteritis. No definable/drainable fluid collection. No evidence of enteric obstruction. 2. Colonic diverticulosis without evidence of diverticulitis. 3. Cholelithiasis without evidence of cholecystitis. Electronically Signed   By: Simonne ComeJohn  Watts M.D.   On: 09/30/2015 21:07    EKG: Not done in ED, will get one.   Assessment/Plan Principal Problem:   Diarrhea Active Problems:   HYPERCHOLESTEROLEMIA   Essential hypertension   COUGH, CHRONIC   Acute renal failure superimposed on stage 3 chronic kidney disease (HCC)    Enteritis   Hyponatremia   Hypokalemia   Elevated lipase   UTI (urinary tract infection)   Pancreatitis, acute   Diarrhea and AP: Etiology is not clear. It has elevated lipase at 131, indicating pancreatitis. Her total bilirubin is slightly elevated at 1.3, but ALP, AST and ALT were normal. Another possibility is enteritis as evidenced by CT scan of abdomen/pelvis, which showed nonspecific stranding surrounding the descending colon. Patient's not septic on admission.  -will admit to med-surg bed for observation -NPO -IVF: 1LNS and then at 100 cc/hr -IV zofran for nausea -abdominal ultrasound to re-evaluate pancreas, gallbladder, and bile ducts  -started Flagyl and Aztreonam per pharmacy -check c diff pcr and stool culture -will get Procalcitonin and trend lactic acid levels   Pancreatitis: -see above  HLD: Last LDL was not on record. Not taking med at home. -Check FLP  Essential hypertension: -Cardizem -IV hydralazine when necessary -Hold spironolactone due to worsening renal function  Gerd: -change PPI-->pepcid until c diff pcr negative  COUGH, CHRONIC: -tessalon  Hypokalemia: K= 3.3  on admission. - Repleted - Check Mg level  Hyponatremia: due to diarrhea. Na 126. Patient does not have altered mental status. -IVF above -check osmo of plasma and urine, urine sodium, TSH  AoCKD-III: Baseline Cre is 1.19 on 09/13/10, her Cre is 2.78, BUN 25 on admission. Likely due to prerenal secondary to dehydration and continuation of diruetics. No hydronephrosis by CT scan. - IVF as above - Check FeUrea - Follow up renal function by BMP - Hold Diuretics, spironolactone  Possible UTI (urinary tract infection): Patient is asymptomatic except for mild abdominal pain. -On Flagyl and aztreonam -Follow-up blood and urine culture   DVT ppx: SQ Heparin   Code Status: Full code Family Communication: None at bed side.   Disposition Plan: Admit to inpatient   Date of Service  10/01/2015    Lorretta Harp [facility] Pager [phone]  If 7PM-7AM, please contact night-coverage [email or web address] Password [facility] 10/01/2015, 3:38 AM

## 2015-09-30 NOTE — ED Notes (Signed)
Pt c/o lack of appetite x 1 month and intermittent diarrhea x 1 week.  Denies pain.  Denies n/v.  Denies GU complaints.  Pt reports being seen by PCP regarding nausea and sts she was given nausea medication.

## 2015-09-30 NOTE — ED Provider Notes (Signed)
CSN: [id number]     Arrival date & time 09/30/15  1619 History   First MD Initiated Contact with Patient 09/30/15 1952     Chief Complaint  Patient presents with  . Lack of Appetite   . Diarrhea    HPI   Jean Lam is a 81 y.o. female with a PMH of HLD, HTN who presents to the ED with decreased appetite and diarrhea. She states she has had a decreased appetite over the past several weeks, and reports diarrhea throughout the day today. She denies exacerbating or alleviating factors. She denies hematochezia or melena. She denies fever, chills, HA, lightheadedness, dizziness, chest pain, shortness of breath, nausea, vomiting, constipation, dysuria, urgency, frequency. She states she experienced abdominal pain earlier today, but that her symptoms have since resolved.   Past Medical History  Diagnosis Date  . Hyperlipidemia   . Achalasia   . Hypertension    Past Surgical History  Procedure Laterality Date  . Abdominal hysterectomy  03/06/11  . Cataract extraction  03/27/2011  . Esophagus surgery     Family History  Problem Relation Age of Onset  . Heart disease Father    Social History  Substance Use Topics  . Smoking status: Former Smoker -- 1.00 packs/day for 50 years    Types: Cigarettes    Quit date: 12/12/2007  . Smokeless tobacco: None  . Alcohol Use: No   OB History    No data available      Review of Systems  Constitutional: Positive for appetite change. Negative for fever and chills.  Respiratory: Negative for shortness of breath.   Cardiovascular: Negative for chest pain.  Gastrointestinal: Positive for abdominal pain and diarrhea. Negative for nausea, vomiting, constipation and blood in stool.  Genitourinary: Negative for dysuria, urgency and frequency.  Neurological: Negative for dizziness, syncope, weakness, light-headedness and headaches.  All other systems reviewed and are negative.     Allergies  Atorvastatin; Avelox; Diovan; Doxycycline;  Levaquin; Metoprolol; Niacin; Oxycodone; Penicillins; Prednisone; and Sulfonamide derivatives  Home Medications   Prior to Admission medications   Medication Sig Start Date End Date Taking? Authorizing Provider  Cholecalciferol (VITAMIN D3) 2000 UNITS capsule Take 2,000 Units by mouth daily.   Yes Historical Provider, MD  diltiazem (CARDIZEM CD) 240 MG 24 hr capsule Take 240 mg by mouth daily.  11/26/11  Yes Historical Provider, MD  latanoprost (XALATAN) 0.005 % ophthalmic solution Place 1 drop into both eyes at bedtime.   Yes Historical Provider, MD  spironolactone-hydrochlorothiazide (ALDACTAZIDE) 25-25 MG per tablet Take 1 tablet by mouth daily.  10/30/11  Yes Historical Provider, MD  vitamin B-12 (CYANOCOBALAMIN) 1000 MCG tablet Take 1,000 mcg by mouth daily.   Yes Historical Provider, MD  benzonatate (TESSALON) 100 MG capsule Take 2 capsules (200 mg total) by mouth every 6 (six) hours as needed for cough. Patient not taking: Reported on 09/30/2015 03/24/13   Barbaraann Share, MD  omeprazole (PRILOSEC) 40 MG capsule TAKE ONE CAPSULE BY MOUTH EVERY DAY Patient not taking: Reported on 09/30/2015 04/22/13   Barbaraann Share, MD    BP 142/53 mmHg  Pulse 79  Temp(Src) 97.5 F (36.4 C) (Oral)  Resp 19  SpO2 96% Physical Exam  Constitutional: She is oriented to person, place, and time. She appears well-developed and well-nourished. No distress.  HENT:  Head: Normocephalic and atraumatic.  Right Ear: External ear normal.  Left Ear: External ear normal.  Nose: Nose normal.  Mouth/Throat: Uvula is midline, oropharynx is  clear and moist and mucous membranes are normal.  Eyes: Conjunctivae, EOM and lids are normal. Pupils are equal, round, and reactive to light. Right eye exhibits no discharge. Left eye exhibits no discharge. No scleral icterus.  Neck: Normal range of motion. Neck supple.  Cardiovascular: Normal rate, regular rhythm, normal heart sounds, intact distal pulses and normal pulses.    Pulmonary/Chest: Effort normal and breath sounds normal. No respiratory distress. She has no wheezes. She has no rales.  Abdominal: Soft. Normal appearance and bowel sounds are normal. She exhibits no distension and no mass. There is no tenderness. There is no rigidity, no rebound and no guarding.  Musculoskeletal: Normal range of motion. She exhibits no edema or tenderness.  Neurological: She is alert and oriented to person, place, and time.  Skin: Skin is warm, dry and intact. No rash noted. She is not diaphoretic. No erythema. No pallor.  Patient's face appears slightly jaundiced.  Psychiatric: She has a normal mood and affect. Her speech is normal and behavior is normal.  Nursing note and vitals reviewed.   ED Course  Procedures (including critical care time)  Labs Review Labs Reviewed  LIPASE, BLOOD - Abnormal; Notable for the following:    Lipase 131 (*)    All other components within normal limits  COMPREHENSIVE METABOLIC PANEL - Abnormal; Notable for the following:    Sodium 126 (*)    Potassium 3.3 (*)    Chloride 89 (*)    Glucose, Bld 116 (*)    BUN 25 (*)    Creatinine, Ser 2.78 (*)    Calcium 8.6 (*)    ALT 13 (*)    Total Bilirubin 1.3 (*)    GFR calc non Af Amer 15 (*)    GFR calc Af Amer 18 (*)    All other components within normal limits  CBC - Abnormal; Notable for the following:    WBC 27.9 (*)    RBC 3.78 (*)    HCT 34.9 (*)    All other components within normal limits  URINALYSIS, ROUTINE W REFLEX MICROSCOPIC (NOT AT [facility]) - Abnormal; Notable for the following:    Color, Urine BROWN (*)    APPearance CLOUDY (*)    Bilirubin Urine LARGE (*)    Ketones, ur 15 (*)    Protein, ur 30 (*)    Nitrite POSITIVE (*)    Leukocytes, UA LARGE (*)    All other components within normal limits  URINE MICROSCOPIC-ADD ON - Abnormal; Notable for the following:    Squamous Epithelial / LPF MANY (*)    Bacteria, UA MANY (*)    All other components within normal  limits  URINE CULTURE    Imaging Review Ct Abdomen Pelvis Wo Contrast  09/30/2015  CLINICAL DATA:  Lack of appetite for 1 month. Intermittent diarrhea. EXAM: CT ABDOMEN AND PELVIS WITHOUT CONTRAST TECHNIQUE: Multidetector CT imaging of the abdomen and pelvis was performed following the standard protocol without IV contrast. COMPARISON:  None. FINDINGS: The lack of intravenous contrast limits the ability to evaluate solid abdominal organs. Normal hepatic contour. There are several punctate layering radiopaque gallstones within otherwise normal appearing gallbladder. No ascites. Note is made of a slightly exaggerated horizontal lie of the right kidney. The left kidney appears mildly atrophic in comparison to the right. No renal stones. No renal stones are seen along the expected course of either ureter or the urinary bladder. Normal noncontrast appearance of the urinary bladder given underdistention. Several phleboliths are  seen within the lower pelvis bilaterally. No urinary obstruction or perinephric stranding. There is mild thickening of the bilateral adrenal glands without discrete nodule. Normal noncontrast appearance of the pancreas and spleen. Incidental is made of several small splenules. There is ill-defined stranding surrounding the descending colon (representative images 20 and 21; coronal image 61, series 3), the etiology of which is not depicted on this examination. Scattered colonic diverticulosis seen within the sigmoid colon (representative image 57, series 2). A small amount of fluid is seen within the pelvic cul-de-sac. No definable/drainable fluid collection. Presumed postsurgical change involving the GE junction. No evidence of enteric obstruction. No pneumoperitoneum, pneumatosis or portal venous gas. Postsurgical change involving the GE junction. Moderate amount of eccentric calcified atherosclerotic plaque within a normal caliber abdominal aorta. No bulky retroperitoneal, mesenteric,  pelvic or inguinal lymphadenopathy. Post hysterectomy.  No discrete adnexal lesion. Limited visualization of lower thorax demonstrates minimal subsegmental atelectasis within the image caudal segment of the lingula. No focal airspace opacities. Normal heart size.  No pericardial effusion. No acute or aggressive osseous abnormalities. Moderate compression deformity involving the anterior aspect of the superior endplate of the L1 vertebral body. Moderate severe multilevel lumbar spine DDD, worse at L3-L4 and L4-L5 with disc space height loss, endplate irregularity and small posteriorly directed disc osteophyte complexes at these locations. Mild presumably degenerative rotatory scoliotic curvature of the lumbar spine. Regional soft tissues appear normal. IMPRESSION: 1. Nonspecific stranding surrounding the descending colon, the etiology of which is not depicted on this examination though likely indicative of infectious or inflammatory enteritis. No definable/drainable fluid collection. No evidence of enteric obstruction. 2. Colonic diverticulosis without evidence of diverticulitis. 3. Cholelithiasis without evidence of cholecystitis. Electronically Signed   By: Simonne ComeJohn  Watts M.D.   On: 09/30/2015 21:07   I have personally reviewed and evaluated these images and lab results as part of my medical decision-making.   EKG Interpretation None      MDM   Final diagnoses:  Elevated lipase  Diarrhea, unspecified type  Enteritis  UTI (lower urinary tract infection)  Pancreatitis    81 year old female presents with decreased appetite for the last month and diarrhea, which worsened today. Denies fever, chills, HA, lightheadedness, dizziness, chest pain, shortness of breath, nausea, vomiting, constipation, dysuria, urgency, frequency. States she experienced abdominal pain earlier today, but that her symptoms have since resolved.  Patient is afebrile. Vital signs stable. Heart regular rate and rhythm. Lungs clear  to auscultation bilaterally. Abdomen soft, nontender, nondistended. No lower extremity edema.  CBC remarkable for leukocytosis with WBC 27.9. CMP remarkable for sodium 126, potassium 3.3, creatinine 2.78, bilirubin 1.3. Lipase elevated at 131. UA remarkable for positive nitrites, large leukocytes, TNTC WBC, consistent with UTI. Urine culture sent. CT abdomen pelvis remarkable for nonspecific stranding surrounding the descending colon, likely indicative of infectious or inflammatory enteritis. Pharmacy called. Discussed patient with pharmacist given antibiotic allergies. Will start on flagyl and aztreonam. Patient given 1 L normal saline bolus in the ED.  Hospitalist consulted for admission. Spoke with Dr. Clyde LundborgNiu, who will admit the patient for further evaluation and management.  BP 142/53 mmHg  Pulse 79  Temp(Src) 97.5 F (36.4 C) (Oral)  Resp 19  SpO2 96%     Mady Gemmalizabeth C Johnay Mano, PA-C 10/01/15 0110  Leta BaptistEmily Roe Nguyen, MD 10/04/15 1515

## 2015-10-01 DIAGNOSIS — R748 Abnormal levels of other serum enzymes: Secondary | ICD-10-CM

## 2015-10-01 DIAGNOSIS — N183 Chronic kidney disease, stage 3 (moderate): Secondary | ICD-10-CM

## 2015-10-01 DIAGNOSIS — E440 Moderate protein-calorie malnutrition: Secondary | ICD-10-CM | POA: Insufficient documentation

## 2015-10-01 DIAGNOSIS — R197 Diarrhea, unspecified: Secondary | ICD-10-CM

## 2015-10-01 DIAGNOSIS — K529 Noninfective gastroenteritis and colitis, unspecified: Principal | ICD-10-CM

## 2015-10-01 DIAGNOSIS — N179 Acute kidney failure, unspecified: Secondary | ICD-10-CM

## 2015-10-01 DIAGNOSIS — K859 Acute pancreatitis without necrosis or infection, unspecified: Secondary | ICD-10-CM | POA: Diagnosis present

## 2015-10-01 LAB — SODIUM, URINE, RANDOM: SODIUM UR: 68 mmol/L

## 2015-10-01 LAB — COMPREHENSIVE METABOLIC PANEL
ALBUMIN: 2.8 g/dL — AB (ref 3.5–5.0)
ALT: 10 U/L — AB (ref 14–54)
AST: 16 U/L (ref 15–41)
Alkaline Phosphatase: 68 U/L (ref 38–126)
Anion gap: 8 (ref 5–15)
BUN: 25 mg/dL — ABNORMAL HIGH (ref 6–20)
CHLORIDE: 98 mmol/L — AB (ref 101–111)
CO2: 22 mmol/L (ref 22–32)
CREATININE: 2.48 mg/dL — AB (ref 0.44–1.00)
Calcium: 7.8 mg/dL — ABNORMAL LOW (ref 8.9–10.3)
GFR calc non Af Amer: 18 mL/min — ABNORMAL LOW (ref >60)
GFR, EST AFRICAN AMERICAN: 20 mL/min — AB (ref >60)
GLUCOSE: 106 mg/dL — AB (ref 65–99)
Potassium: 4.1 mmol/L (ref 3.5–5.1)
SODIUM: 128 mmol/L — AB (ref 135–145)
Total Bilirubin: 0.7 mg/dL (ref 0.3–1.2)
Total Protein: 5.9 g/dL — ABNORMAL LOW (ref 6.5–8.1)

## 2015-10-01 LAB — CBC
HCT: 28 % — ABNORMAL LOW (ref 36.0–46.0)
HEMOGLOBIN: 9.9 g/dL — AB (ref 12.0–15.0)
MCH: 32.7 pg (ref 26.0–34.0)
MCHC: 35.4 g/dL (ref 30.0–36.0)
MCV: 92.4 fL (ref 78.0–100.0)
PLATELETS: 256 10*3/uL (ref 150–400)
RBC: 3.03 MIL/uL — AB (ref 3.87–5.11)
RDW: 12.9 % (ref 11.5–15.5)
WBC: 22.5 10*3/uL — ABNORMAL HIGH (ref 4.0–10.5)

## 2015-10-01 LAB — OSMOLALITY, URINE: Osmolality, Ur: 320 mOsm/kg — ABNORMAL LOW (ref 390–1090)

## 2015-10-01 LAB — APTT: APTT: 32 s (ref 24–37)

## 2015-10-01 LAB — PROCALCITONIN: Procalcitonin: 77.34 ng/mL

## 2015-10-01 LAB — PROTIME-INR
INR: 1.02 (ref 0.00–1.49)
Prothrombin Time: 13.6 seconds (ref 11.6–15.2)

## 2015-10-01 LAB — LACTIC ACID, PLASMA
Lactic Acid, Venous: 0.8 mmol/L (ref 0.5–2.0)
Lactic Acid, Venous: 0.9 mmol/L (ref 0.5–2.0)

## 2015-10-01 LAB — OSMOLALITY: OSMOLALITY: 270 mosm/kg — AB (ref 275–300)

## 2015-10-01 LAB — CREATININE, URINE, RANDOM: CREATININE, URINE: 65.36 mg/dL

## 2015-10-01 LAB — TSH: TSH: 0.968 u[IU]/mL (ref 0.350–4.500)

## 2015-10-01 LAB — MAGNESIUM: Magnesium: 1.5 mg/dL — ABNORMAL LOW (ref 1.7–2.4)

## 2015-10-01 MED ORDER — DEXTROSE 5 % IV SOLN
500.0000 mg | Freq: Three times a day (TID) | INTRAVENOUS | Status: DC
  Administered 2015-10-01 – 2015-10-02 (×3): 500 mg via INTRAVENOUS
  Filled 2015-10-01 (×5): qty 0.5

## 2015-10-01 MED ORDER — ENOXAPARIN SODIUM 30 MG/0.3ML ~~LOC~~ SOLN
30.0000 mg | SUBCUTANEOUS | Status: DC
  Administered 2015-10-01 – 2015-10-02 (×2): 30 mg via SUBCUTANEOUS
  Filled 2015-10-01 (×3): qty 0.3

## 2015-10-01 NOTE — Care Management Note (Signed)
Case Management Note  Patient Details  Name: Jean Lam MRN: [id number] Date of Birth: 05/15/1934  Subjective/Objective:            Sepsis and colitis        Action/Plan: Will follow for progression and needs  Expected Discharge Date:  10/04/15               Expected Discharge Plan:  Home/Self Care  In-House Referral:  NA  Discharge planning Services  CM Consult  Post Acute Care Choice:  NA Choice offered to:  NA  DME Arranged:    DME Agency:     HH Arranged:    HH Agency:     Status of Service:  In process, will continue to follow  Medicare Important Message Given:    Date Medicare IM Given:    Medicare IM give by:    Date Additional Medicare IM Given:    Additional Medicare Important Message give by:     If discussed at Long Length of Stay Meetings, dates discussed:    Additional Comments:  Golda AcreDavis, Rhonda Lynn, RN 10/01/2015, 11:49 AM

## 2015-10-01 NOTE — Progress Notes (Signed)
[facility] PROGRESS NOTE  KELBI RENSTROM VZD:[id number] DOB: 1934-10-24 DOA: 09/30/2015 PCP: Pamelia Hoit, MD  Assessment/Plan: Diarrhea and Abdomial pain -improving, CT with mild colitis, WBC 22K-better from 27K last night -concern for Cdiff, await PCR, continue Flagyl for Now, change to Po Vanc if PCR positive -bland diet, continue IVF, supportive care, add GI pathogen panel -DC abd [place], also on Aztreonam-multiple allergies noted  Elevated Lipase -131, clinically not concerned abt pancreatitis, will repeat Lipase tomorrow  Hyponatremia: due to diarrhea, volume depletion, diurteics -improving with hydration.  AoCKD-III: Baseline Cre is 1.19 on 09/13/10, her Cre is 2.78, BUN 25 on admission.  - prerenal secondary to dehydration and concomitant use of diruetics. -IVF as above, improving - Hold Diuretics, spironolactone  Possible UTI (urinary tract infection): Patient is asymptomatic except for mild abdominal pain. -On Flagyl and aztreonam -Follow-up blood and urine culture  Essential hypertension: -Cardizem -IV hydralazine PRN -Hold spironolactone due to worsening renal function  Gerd: -change PPI-->pepcid until c diff pcr negative  COUGH, CHRONIC: -tessalon  Hypokalemia: K= 3.3 on admission. - Repleted - FU Mg level  DVT ppx: SQ Heparin   Code Status: Full code Family Communication: None at bed side.  Disposition Plan: home when improved    HPI/Subjective: Diarrhea improving, feels better, wants to eat  Objective: Filed Vitals:   10/01/15 0803  BP: 133/38  Pulse: 73  Temp: 97.8 F (36.6 C)  Resp: 18    Intake/Output Summary (Last 24 hours) at 10/01/15 1034 Last data filed at 10/01/15 0847  Gross per 24 hour  Intake 508.33 ml  Output    400 ml  Net 108.33 ml   Filed Weights   10/01/15 0103  Weight: 61.236 kg (135 lb)    Exam:   General:  AAOx3  Cardiovascular: S1S2/RRR  Respiratory: CTAB  Abdomen: soft, NT, BS  present  Musculoskeletal: no edema c/c  Data Reviewed: Basic Metabolic Panel:  Recent Labs Lab 09/30/15 1713 10/01/15 0222  NA 126* 128*  K 3.3* 4.1  CL 89* 98*  CO2 22 22  GLUCOSE 116* 106*  BUN 25* 25*  CREATININE 2.78* 2.48*  CALCIUM 8.6* 7.8*  MG  --  1.5*   Liver Function Tests:  Recent Labs Lab 09/30/15 1713 10/01/15 0222  AST 29 16  ALT 13* 10*  ALKPHOS 89 68  BILITOT 1.3* 0.7  PROT 7.2 5.9*  ALBUMIN 3.6 2.8*    Recent Labs Lab 09/30/15 1713  LIPASE 131*   No results for input(s): AMMONIA in the last 168 hours. CBC:  Recent Labs Lab 09/30/15 1713 10/01/15 0222  WBC 27.9* 22.5*  HGB 12.2 9.9*  HCT 34.9* 28.0*  MCV 92.3 92.4  PLT 326 256   Cardiac Enzymes: No results for input(s): CKTOTAL, CKMB, CKMBINDEX, TROPONINI in the last 168 hours. BNP (last 3 results) No results for input(s): BNP in the last 8760 hours.  ProBNP (last 3 results) No results for input(s): PROBNP in the last 8760 hours.  CBG: No results for input(s): GLUCAP in the last 168 hours.  Recent Results (from the past 240 hour(s))  Culture, blood (x 2)     Status: None (Preliminary result)   Collection Time: 09/30/15 11:45 PM  Result Value Ref Range Status   Specimen Description   Final    BLOOD RIGHT ANTECUBITAL Performed at [facility]    Special Requests BOTTLES DRAWN AEROBIC ONLY  Final   Culture PENDING  Incomplete   Report Status PENDING  Incomplete  Studies: Ct Abdomen Pelvis Wo Contrast  09/30/2015  CLINICAL DATA:  Lack of appetite for 1 month. Intermittent diarrhea. EXAM: CT ABDOMEN AND PELVIS WITHOUT CONTRAST TECHNIQUE: Multidetector CT imaging of the abdomen and pelvis was performed following the standard protocol without IV contrast. COMPARISON:  None. FINDINGS: The lack of intravenous contrast limits the ability to evaluate solid abdominal organs. Normal hepatic contour. There are several punctate layering radiopaque gallstones within  otherwise normal appearing gallbladder. No ascites. Note is made of a slightly exaggerated horizontal lie of the right kidney. The left kidney appears mildly atrophic in comparison to the right. No renal stones. No renal stones are seen along the expected course of either ureter or the urinary bladder. Normal noncontrast appearance of the urinary bladder given underdistention. Several phleboliths are seen within the lower pelvis bilaterally. No urinary obstruction or perinephric stranding. There is mild thickening of the bilateral adrenal glands without discrete nodule. Normal noncontrast appearance of the pancreas and spleen. Incidental is made of several small splenules. There is ill-defined stranding surrounding the descending colon (representative images 20 and 21; coronal image 61, series 3), the etiology of which is not depicted on this examination. Scattered colonic diverticulosis seen within the sigmoid colon (representative image 57, series 2). A small amount of fluid is seen within the pelvic cul-de-sac. No definable/drainable fluid collection. Presumed postsurgical change involving the GE junction. No evidence of enteric obstruction. No pneumoperitoneum, pneumatosis or portal venous gas. Postsurgical change involving the GE junction. Moderate amount of eccentric calcified atherosclerotic plaque within a normal caliber abdominal aorta. No bulky retroperitoneal, mesenteric, pelvic or inguinal lymphadenopathy. Post hysterectomy.  No discrete adnexal lesion. Limited visualization of lower thorax demonstrates minimal subsegmental atelectasis within the image caudal segment of the lingula. No focal airspace opacities. Normal heart size.  No pericardial effusion. No acute or aggressive osseous abnormalities. Moderate compression deformity involving the anterior aspect of the superior endplate of the L1 vertebral body. Moderate severe multilevel lumbar spine DDD, worse at L3-L4 and L4-L5 with disc space height  loss, endplate irregularity and small posteriorly directed disc osteophyte complexes at these locations. Mild presumably degenerative rotatory scoliotic curvature of the lumbar spine. Regional soft tissues appear normal. IMPRESSION: 1. Nonspecific stranding surrounding the descending colon, the etiology of which is not depicted on this examination though likely indicative of infectious or inflammatory enteritis. No definable/drainable fluid collection. No evidence of enteric obstruction. 2. Colonic diverticulosis without evidence of diverticulitis. 3. Cholelithiasis without evidence of cholecystitis. Electronically Signed   By: Simonne ComeJohn  Watts M.D.   On: 09/30/2015 21:07    Scheduled Meds: . aztreonam  500 mg Intravenous Q8H  . cholecalciferol  2,000 Units Oral Daily  . diltiazem  240 mg Oral Daily  . famotidine (PEPCID) IV  20 mg Intravenous Q12H  . heparin  5,000 Units Subcutaneous 3 times per day  . latanoprost  1 drop Both Eyes QHS  . metronidazole  500 mg Intravenous Q8H  . sodium chloride  3 mL Intravenous Q12H  . vitamin B-12  1,000 mcg Oral Daily   Continuous Infusions: . sodium chloride 100 mL/hr at 10/01/15 0116   Antibiotics Given (last 72 hours)    Date/Time Action Medication Dose Rate   10/01/15 0000 Given   aztreonam (AZACTAM) 2 g in dextrose 5 % 50 mL IVPB 2 g 100 mL/hr   10/01/15 0735 Given   metroNIDAZOLE (FLAGYL) IVPB 500 mg 500 mg 100 mL/hr   10/01/15 [id number] Given   aztreonam (AZACTAM) 500 mg in  dextrose 5 % 50 mL IVPB 500 mg 100 mL/hr      Principal Problem:   Diarrhea Active Problems:   HYPERCHOLESTEROLEMIA   Essential hypertension   COUGH, CHRONIC   Acute renal failure superimposed on stage 3 chronic kidney disease (HCC)   Enteritis   Hyponatremia   Hypokalemia   Elevated lipase   UTI (urinary tract infection)   Pancreatitis, acute    Time spent:63min    [facility]  [facility] Pager [phone]. If 7PM-7AM, please contact night-coverage at  [email or web address], password [facility] 10/01/2015, 10:34 AM  LOS: 1 day

## 2015-10-01 NOTE — Progress Notes (Signed)
ANTIBIOTIC CONSULT NOTE - INITIAL  Pharmacy Consult for Aztreonam  Indication: Enteritis, UTI  Allergies  Allergen Reactions  . Atorvastatin   . Avelox [Moxifloxacin Hcl In Nacl]   . Diovan [Valsartan]   . Doxycycline   . Levaquin [Levofloxacin In D5w]     Rash  . Metoprolol   . Niacin   . Oxycodone     Upset stomach  . Penicillins   . Prednisone     redness  . Sulfonamide Derivatives     Patient Measurements: Height: 5\' 2"  (157.5 cm) Weight: 135 lb (61.236 kg) IBW/kg (Calculated) : 50.1  Vital Signs: Temp: 98.2 F (36.8 C) (10/20 2320) Temp Source: Oral (10/20 2320) BP: 151/70 mmHg (10/20 2320) Pulse Rate: 75 (10/20 2230) Intake/Output from previous day:   Intake/Output from this shift:    Labs:  Recent Labs  09/30/15 1713  WBC 27.9*  HGB 12.2  PLT 326  CREATININE 2.78*   Estimated Creatinine Clearance: 14.3 mL/min (by C-G formula based on Cr of 2.78). No results for input(s): VANCOTROUGH, VANCOPEAK, VANCORANDOM, GENTTROUGH, GENTPEAK, GENTRANDOM, TOBRATROUGH, TOBRAPEAK, TOBRARND, AMIKACINPEAK, AMIKACINTROU, AMIKACIN in the last 72 hours.   Microbiology: No results found for this or any previous visit (from the past 720 hour(s)).  Medical History: Past Medical History  Diagnosis Date  . Hyperlipidemia   . Achalasia   . Hypertension   . CKD (chronic kidney disease), stage III   . Chronic cough     Medications:  Scheduled:  . cholecalciferol  2,000 Units Oral Daily  . diltiazem  240 mg Oral Daily  . famotidine (PEPCID) IV  20 mg Intravenous Q12H  . heparin  5,000 Units Subcutaneous 3 times per day  . latanoprost  1 drop Both Eyes QHS  . metronidazole  500 mg Intravenous Q8H  . sodium chloride  3 mL Intravenous Q12H  . vitamin B-12  1,000 mcg Oral Daily   Infusions:  . sodium chloride     Assessment:  [age] yr female with diarrhea and loss of appetite  Patient to be treated empirically for enteritis and UTI  Pharmacy consulted to dose  Aztreonam  MD dosing Metronidazole   10/20 >>Metronidazole >> 10/21 >>Aztreonam >>    10/21 blood: 10/21 urine: 10/21 stool:  10/21 C. Diff:  Goal of Therapy:  Eradication of infection  Plan:  Follow up culture results  Aztreonam 2gm IV x 1 then 500mg  IV q8h Metronidazole per MD dosing  Shaunie Boehm, Joselyn GlassmanLeann Trefz, PharmD 10/01/2015,1:10 AM

## 2015-10-01 NOTE — Progress Notes (Signed)
OT Cancellation Note  Patient Details Name: Jean Lam MRN: [id number] DOB: 11/01/1934   Cancelled Treatment:    Reason Eval/Treat Not Completed: PT screened, no needs identified, will sign off.  Karmella Bouvier 10/01/2015, 3:21 PM  Marica OtterMaryellen Dondi Aime, OTR/L [phone] 10/01/2015

## 2015-10-01 NOTE — Evaluation (Signed)
Physical Therapy Evaluation Patient Details Name: Jean Lam Lindy MRN: [id number] DOB: 03/16/1934 Today's Date: 10/01/2015   History of Present Illness  Jean Lam Sitts is a 81 y.o. female with PMH of hypertension, hyperlipidemia, GERD, achalasia, CKD-III, chronic cough, who presents with diarrhea  Clinical Impression  Pt is independent with bed mobility, transfers, and ambulation. She walked 240' without an assistive device, no loss of balance. She is safe to DC home from PT standpoint. No further PT indicated, PT will sign off.     Follow Up Recommendations No PT follow up    Equipment Recommendations  None recommended by PT    Recommendations for Other Services       Precautions / Restrictions Precautions Precautions: None Precaution Comments: pt denies h/o falls in past year Restrictions Weight Bearing Restrictions: No      Mobility  Bed Mobility Overal bed mobility: Independent                Transfers Overall transfer level: Independent                  Ambulation/Gait Ambulation/Gait assistance: Independent Ambulation Distance (Feet): 240 Feet Assistive device: None Gait Pattern/deviations: WFL(Within Functional Limits)   Gait velocity interpretation: at or above normal speed for age/gender General Gait Details: steady without AD, no LOB  Stairs            Wheelchair Mobility    Modified Rankin (Stroke Patients Only)       Balance Overall balance assessment: No apparent balance deficits (not formally assessed)                                           Pertinent Vitals/Pain Pain Assessment: No/denies pain    Home Living Family/patient expects to be discharged to:: Private residence Living Arrangements: Spouse/significant other Available Help at Discharge: Family;Available 24 hours/day Type of Home: House       Home Layout: Laundry or work area in basement Home Equipment: None      Prior Function Level  of Independence: Independent               Higher education careers adviserHand Dominance        Extremity/Trunk Assessment   Upper Extremity Assessment: Overall WFL for tasks assessed           Lower Extremity Assessment: Overall WFL for tasks assessed      Cervical / Trunk Assessment: Kyphotic (mildly kyphotic)  Communication   Communication: HOH  Cognition Arousal/Alertness: Awake/alert Behavior During Therapy: WFL for tasks assessed/performed Overall Cognitive Status: Within Functional Limits for tasks assessed                      General Comments      Exercises        Assessment/Plan    PT Assessment Patent does not need any further PT services  PT Diagnosis     PT Problem List    PT Treatment Interventions     PT Goals (Current goals can be found in the Care Plan section) Acute Rehab PT Goals Patient Stated Goal: to clean my house PT Goal Formulation: All assessment and education complete, DC therapy    Frequency     Barriers to discharge        Co-evaluation               End  of Session Equipment Utilized During Treatment: Gait belt Activity Tolerance: Patient tolerated treatment well Patient left: in chair;with call bell/phone within reach Nurse Communication: Mobility status         Time: 0920-0933 PT Time Calculation (min) (ACUTE ONLY): 13 min   Charges:   PT Evaluation $Initial PT Evaluation Tier I: 1 Procedure     PT G Codes:        Tamala Ser 10/01/2015, 9:37 AM [phone]

## 2015-10-01 NOTE — Progress Notes (Signed)
Initial Nutrition Assessment  DOCUMENTATION CODES:   Non-severe (moderate) malnutrition in context of acute illness/injury  INTERVENTION:  - Encourage PO intakes of meals - RD will continue to monitor for needs  NUTRITION DIAGNOSIS:   Inadequate oral intake related to acute illness, poor appetite as evidenced by per patient/family report  GOAL:   Patient will meet greater than or equal to 90% of their needs  MONITOR:   PO intake, Weight trends, Labs, I & O's  REASON FOR ASSESSMENT:   Malnutrition Screening Tool    ASSESSMENT:   81 y.o. female with PMH of hypertension, hyperlipidemia, GERD, achalasia, CKD-III, chronic cough, who presents with diarrhea.  Pt seen for MST. BMI indicates normal weight status. Pt ate 100% of lunch which consisted of [place] following diet advancement today at 1037. Pt states since intakes she has had mild abdominal pain, no nausea. She states PTA she was having diarrhea x2 weeks which worsened on day of admission. She denies abdominal pain or nausea  PTA but states she has had decreased appetite for several weeks; she is unable to give further specification on time frame.  She states she would mainly eat snacks throughout the day rather than set meals the past few weeks. Noted bottle of Glucerna Shake at bedside which pt states is her husband's. She was not drinking any nutrition supplements PTA and states she does not like them.   Pt states weight loss recently but is unsure of amount of weight lost or time frame for weight loss. Husband states pt has lost ~10 lbs in the past 3 weeks. This would represent  7% body weight loss in this time frame which is significant. No muscle or fat wasting noted during physical exam.  Pt likely not meeting needs PTA per her report. Medications reviewed. Labs reviewed; Na: 128 mmol/L, Cl: 98 mmol/L, BUN/creatinine elevated, Ca: 7.8 mg/dL, Mg: 1.5 mg/dL, GFR: 18.  Diet Order:  DIET SOFT Room service  appropriate?: Yes; Fluid consistency:: Thin  Skin:  Reviewed, no issues  Last BM:  10/20  Height:   Ht Readings from Last 1 Encounters:  10/01/15 5\' 2"  (1.575 m)    Weight:   Wt Readings from Last 1 Encounters:  10/01/15 135 lb (61.236 kg)    Ideal Body Weight:  50 kg (kg)  BMI:  Body mass index is 24.69 kg/(m^2).  Estimated Nutritional Needs:   Kcal:  1400-1600  Protein:  55-65 grams  Fluid:  2.2-2.5 L/day  EDUCATION NEEDS:   No education needs identified at this time     Trenton GammonJessica Kwamane Whack, RD, LDN Inpatient Clinical Dietitian Pager # [phone] After hours/weekend pager # [phone]

## 2015-10-02 LAB — COMPREHENSIVE METABOLIC PANEL
ALK PHOS: 62 U/L (ref 38–126)
ALT: 9 U/L — AB (ref 14–54)
ANION GAP: 8 (ref 5–15)
AST: 15 U/L (ref 15–41)
Albumin: 2.6 g/dL — ABNORMAL LOW (ref 3.5–5.0)
BILIRUBIN TOTAL: 0.5 mg/dL (ref 0.3–1.2)
BUN: 20 mg/dL (ref 6–20)
CALCIUM: 8.1 mg/dL — AB (ref 8.9–10.3)
CO2: 22 mmol/L (ref 22–32)
Chloride: 101 mmol/L (ref 101–111)
Creatinine, Ser: 1.87 mg/dL — ABNORMAL HIGH (ref 0.44–1.00)
GFR calc Af Amer: 29 mL/min — ABNORMAL LOW (ref >60)
GFR, EST NON AFRICAN AMERICAN: 25 mL/min — AB (ref >60)
GLUCOSE: 81 mg/dL (ref 65–99)
POTASSIUM: 3.6 mmol/L (ref 3.5–5.1)
Sodium: 131 mmol/L — ABNORMAL LOW (ref 135–145)
TOTAL PROTEIN: 5.7 g/dL — AB (ref 6.5–8.1)

## 2015-10-02 LAB — CBC
HEMATOCRIT: 26.5 % — AB (ref 36.0–46.0)
HEMOGLOBIN: 9.1 g/dL — AB (ref 12.0–15.0)
MCH: 32.5 pg (ref 26.0–34.0)
MCHC: 34.3 g/dL (ref 30.0–36.0)
MCV: 94.6 fL (ref 78.0–100.0)
PLATELETS: 253 10*3/uL (ref 150–400)
RBC: 2.8 MIL/uL — AB (ref 3.87–5.11)
RDW: 13.2 % (ref 11.5–15.5)
WBC: 11.4 10*3/uL — AB (ref 4.0–10.5)

## 2015-10-02 LAB — C DIFFICILE QUICK SCREEN W PCR REFLEX
C DIFFICLE (CDIFF) ANTIGEN: NEGATIVE
C Diff interpretation: NEGATIVE
C Diff toxin: NEGATIVE

## 2015-10-02 LAB — URINE CULTURE

## 2015-10-02 LAB — UREA NITROGEN, URINE: UREA NITROGEN UR: 291 mg/dL

## 2015-10-02 LAB — LIPASE, BLOOD: LIPASE: 104 U/L — AB (ref 11–51)

## 2015-10-02 MED ORDER — PANTOPRAZOLE SODIUM 40 MG PO TBEC
40.0000 mg | DELAYED_RELEASE_TABLET | Freq: Every day | ORAL | Status: DC
  Administered 2015-10-02 – 2015-10-03 (×2): 40 mg via ORAL
  Filled 2015-10-02 (×2): qty 1

## 2015-10-02 MED ORDER — INFLUENZA VAC SPLIT QUAD 0.5 ML IM SUSY: 0.5000 mL | PREFILLED_SYRINGE | INTRAMUSCULAR | Status: DC | PRN

## 2015-10-02 MED ORDER — CEFUROXIME AXETIL 250 MG PO TABS
250.0000 mg | ORAL_TABLET | Freq: Two times a day (BID) | ORAL | Status: DC
  Administered 2015-10-02 – 2015-10-03 (×3): 250 mg via ORAL
  Filled 2015-10-02 (×5): qty 1

## 2015-10-02 MED ORDER — PNEUMOCOCCAL VAC POLYVALENT 25 MCG/0.5ML IJ INJ: 0.5000 mL | INJECTION | INTRAMUSCULAR | Status: DC | PRN

## 2015-10-02 MED ORDER — METRONIDAZOLE 250 MG PO TABS
250.0000 mg | ORAL_TABLET | Freq: Three times a day (TID) | ORAL | Status: DC
Start: 2015-10-02 — End: 2015-10-03
  Administered 2015-10-02 – 2015-10-03 (×3): 250 mg via ORAL
  Filled 2015-10-02 (×6): qty 1

## 2015-10-02 NOTE — Progress Notes (Signed)
[facility] PROGRESS NOTE  Berdine AddisonLouise B Danzy ZOX:[id number] DOB: 04/04/1934 DOA: 09/30/2015 PCP: Pamelia HoitWILSON,FRED HENRY, MD  Assessment/Plan: Diarrhea and Abdomial pain -improving, no further diarrhea, CT with mild colitis,  -WBC improving 27->22->11K -doubt Cdiff, await PCR, continue Flagyl, stop Aztreonam and add cefuroxime for Gram negative coverage, d/w PharmD, multiple allergies to PCN/Quinolones, doxy etc -cut down IVF -supportive care -FU with GI for colonoscopy down the road  Elevated Lipase -131, clinically not concerned abt pancreatitis, now 104  Hyponatremia: due to diarrhea, volume depletion, diurteics -improving with hydration.  AoCKD-III: Baseline Cre is 1.19 on 09/13/10, her Cre was 2.78 on admission.  - prerenal secondary to dehydration and concomitant use of diruetics. -IVF as above, improving, cut down IVF - Hold Diuretics, spironolactone  Possible UTI (urinary tract infection): Patient is asymptomatic except for mild abdominal pain. -Follow-up blood and urine culture -changed Abx as above  Essential hypertension: -Cardizem -IV hydralazine PRN -Held spironolactone due to worsening renal function  Gerd: -resume PPI, doubt Cdiff  COUGH, CHRONIC: -tessalon  Hypokalemia: K= 3.3 on admission. - Repleted - FU Mg level  DVT ppx: SQ Heparin   Code Status: Full code Family Communication: None at bed side.  Disposition Plan: home tomorrow if stable    HPI/Subjective: No further diarrhea, soft stool today, eating, no complaints  Objective: Filed Vitals:   10/02/15 0510  BP: 133/46  Pulse: 64  Temp: 97.9 F (36.6 C)  Resp: 20    Intake/Output Summary (Last 24 hours) at 10/02/15 1025 Last data filed at 10/02/15 0906  Gross per 24 hour  Intake 3093.32 ml  Output   1350 ml  Net 1743.32 ml   Filed Weights   10/01/15 0103  Weight: 61.236 kg (135 lb)    Exam:   General:  AAOx3  Cardiovascular: S1S2/RRR  Respiratory: CTAB  Abdomen:  soft, NT, BS present  Musculoskeletal: no edema c/c  Data Reviewed: Basic Metabolic Panel:  Recent Labs Lab 09/30/15 1713 10/01/15 0222 10/02/15 0610  NA 126* 128* 131*  K 3.3* 4.1 3.6  CL 89* 98* 101  CO2 22 22 22   GLUCOSE 116* 106* 81  BUN 25* 25* 20  CREATININE 2.78* 2.48* 1.87*  CALCIUM 8.6* 7.8* 8.1*  MG  --  1.5*  --    Liver Function Tests:  Recent Labs Lab 09/30/15 1713 10/01/15 0222 10/02/15 0610  AST 29 16 15   ALT 13* 10* 9*  ALKPHOS 89 68 62  BILITOT 1.3* 0.7 0.5  PROT 7.2 5.9* 5.7*  ALBUMIN 3.6 2.8* 2.6*    Recent Labs Lab 09/30/15 1713 10/02/15 0610  LIPASE 131* 104*   No results for input(s): AMMONIA in the last 168 hours. CBC:  Recent Labs Lab 09/30/15 1713 10/01/15 0222 10/02/15 0610  WBC 27.9* 22.5* 11.4*  HGB 12.2 9.9* 9.1*  HCT 34.9* 28.0* 26.5*  MCV 92.3 92.4 94.6  PLT 326 256 253   Cardiac Enzymes: No results for input(s): CKTOTAL, CKMB, CKMBINDEX, TROPONINI in the last 168 hours. BNP (last 3 results) No results for input(s): BNP in the last 8760 hours.  ProBNP (last 3 results) No results for input(s): PROBNP in the last 8760 hours.  CBG: No results for input(s): GLUCAP in the last 168 hours.  Recent Results (from the past 240 hour(s))  Culture, blood (x 2)     Status: None (Preliminary result)   Collection Time: 09/30/15 11:45 PM  Result Value Ref Range Status   Specimen Description BLOOD RIGHT ANTECUBITAL  Final  Special Requests BOTTLES DRAWN AEROBIC ONLY  Final   Culture   Final    NO GROWTH 1 DAY Performed at [facility]    Report Status PENDING  Incomplete  Culture, blood (x 2)     Status: None (Preliminary result)   Collection Time: 09/30/15 11:50 PM  Result Value Ref Range Status   Specimen Description BLOOD BLOOD RIGHT HAND  Final   Special Requests BOTTLES DRAWN AEROBIC ONLY  Final   Culture   Final    NO GROWTH 1 DAY Performed at [facility]    Report Status PENDING   Incomplete     Studies: Ct Abdomen Pelvis Wo Contrast  09/30/2015  CLINICAL DATA:  Lack of appetite for 1 month. Intermittent diarrhea. EXAM: CT ABDOMEN AND PELVIS WITHOUT CONTRAST TECHNIQUE: Multidetector CT imaging of the abdomen and pelvis was performed following the standard protocol without IV contrast. COMPARISON:  None. FINDINGS: The lack of intravenous contrast limits the ability to evaluate solid abdominal organs. Normal hepatic contour. There are several punctate layering radiopaque gallstones within otherwise normal appearing gallbladder. No ascites. Note is made of a slightly exaggerated horizontal lie of the right kidney. The left kidney appears mildly atrophic in comparison to the right. No renal stones. No renal stones are seen along the expected course of either ureter or the urinary bladder. Normal noncontrast appearance of the urinary bladder given underdistention. Several phleboliths are seen within the lower pelvis bilaterally. No urinary obstruction or perinephric stranding. There is mild thickening of the bilateral adrenal glands without discrete nodule. Normal noncontrast appearance of the pancreas and spleen. Incidental is made of several small splenules. There is ill-defined stranding surrounding the descending colon (representative images 20 and 21; coronal image 61, series 3), the etiology of which is not depicted on this examination. Scattered colonic diverticulosis seen within the sigmoid colon (representative image 57, series 2). A small amount of fluid is seen within the pelvic cul-de-sac. No definable/drainable fluid collection. Presumed postsurgical change involving the GE junction. No evidence of enteric obstruction. No pneumoperitoneum, pneumatosis or portal venous gas. Postsurgical change involving the GE junction. Moderate amount of eccentric calcified atherosclerotic plaque within a normal caliber abdominal aorta. No bulky retroperitoneal, mesenteric, pelvic or inguinal  lymphadenopathy. Post hysterectomy.  No discrete adnexal lesion. Limited visualization of lower thorax demonstrates minimal subsegmental atelectasis within the image caudal segment of the lingula. No focal airspace opacities. Normal heart size.  No pericardial effusion. No acute or aggressive osseous abnormalities. Moderate compression deformity involving the anterior aspect of the superior endplate of the L1 vertebral body. Moderate severe multilevel lumbar spine DDD, worse at L3-L4 and L4-L5 with disc space height loss, endplate irregularity and small posteriorly directed disc osteophyte complexes at these locations. Mild presumably degenerative rotatory scoliotic curvature of the lumbar spine. Regional soft tissues appear normal. IMPRESSION: 1. Nonspecific stranding surrounding the descending colon, the etiology of which is not depicted on this examination though likely indicative of infectious or inflammatory enteritis. No definable/drainable fluid collection. No evidence of enteric obstruction. 2. Colonic diverticulosis without evidence of diverticulitis. 3. Cholelithiasis without evidence of cholecystitis. Electronically Signed   By: Simonne Come M.D.   On: 09/30/2015 21:07    Scheduled Meds: . cefUROXime  250 mg Oral BID WC  . cholecalciferol  2,000 Units Oral Daily  . diltiazem  240 mg Oral Daily  . enoxaparin (LOVENOX) injection  30 mg Subcutaneous Q24H  . latanoprost  1 drop Both Eyes QHS  .  metroNIDAZOLE  250 mg Oral 3 times per day  . pantoprazole  40 mg Oral Daily  . sodium chloride  3 mL Intravenous Q12H  . vitamin B-12  1,000 mcg Oral Daily   Continuous Infusions: . sodium chloride 100 mL/hr at 10/02/15 1324   Antibiotics Given (last 72 hours)    Date/Time Action Medication Dose Rate   10/01/15 0000 Given   aztreonam (AZACTAM) 2 g in dextrose 5 % 50 mL IVPB 2 g 100 mL/hr   10/01/15 0735 Given   metroNIDAZOLE (FLAGYL) IVPB 500 mg 500 mg 100 mL/hr   10/01/15 4010 Given   aztreonam  (AZACTAM) 500 mg in dextrose 5 % 50 mL IVPB 500 mg 100 mL/hr   10/01/15 1428 Given   metroNIDAZOLE (FLAGYL) IVPB 500 mg 500 mg 100 mL/hr   10/01/15 1639 Given   aztreonam (AZACTAM) 500 mg in dextrose 5 % 50 mL IVPB 500 mg 100 mL/hr   10/01/15 2247 Given   metroNIDAZOLE (FLAGYL) IVPB 500 mg 500 mg 100 mL/hr   10/02/15 0058 Given   aztreonam (AZACTAM) 500 mg in dextrose 5 % 50 mL IVPB 500 mg 100 mL/hr   10/02/15 0735 Given   metroNIDAZOLE (FLAGYL) IVPB 500 mg 500 mg 100 mL/hr      Principal Problem:   Diarrhea Active Problems:   HYPERCHOLESTEROLEMIA   Essential hypertension   COUGH, CHRONIC   Acute renal failure superimposed on stage 3 chronic kidney disease (HCC)   Enteritis   Hyponatremia   Hypokalemia   Elevated lipase   UTI (urinary tract infection)   Pancreatitis, acute   Malnutrition of moderate degree    Time spent:9min    [facility]  [facility] Pager [phone]. If 7PM-7AM, please contact night-coverage at [email or web address], password [email or web address] 10/02/2015, 10:25 AM  LOS: 2 days

## 2015-10-03 DIAGNOSIS — E876 Hypokalemia: Secondary | ICD-10-CM

## 2015-10-03 DIAGNOSIS — I10 Essential (primary) hypertension: Secondary | ICD-10-CM

## 2015-10-03 LAB — CBC
HCT: 26.6 % — ABNORMAL LOW (ref 36.0–46.0)
HEMOGLOBIN: 9.2 g/dL — AB (ref 12.0–15.0)
MCH: 32.5 pg (ref 26.0–34.0)
MCHC: 34.6 g/dL (ref 30.0–36.0)
MCV: 94 fL (ref 78.0–100.0)
Platelets: 242 10*3/uL (ref 150–400)
RBC: 2.83 MIL/uL — ABNORMAL LOW (ref 3.87–5.11)
RDW: 13.3 % (ref 11.5–15.5)
WBC: 8.8 10*3/uL (ref 4.0–10.5)

## 2015-10-03 LAB — BASIC METABOLIC PANEL
Anion gap: 8 (ref 5–15)
BUN: 14 mg/dL (ref 6–20)
CALCIUM: 8.4 mg/dL — AB (ref 8.9–10.3)
CHLORIDE: 101 mmol/L (ref 101–111)
CO2: 23 mmol/L (ref 22–32)
CREATININE: 1.48 mg/dL — AB (ref 0.44–1.00)
GFR calc non Af Amer: 33 mL/min — ABNORMAL LOW (ref >60)
GFR, EST AFRICAN AMERICAN: 38 mL/min — AB (ref >60)
Glucose, Bld: 96 mg/dL (ref 65–99)
Potassium: 3.4 mmol/L — ABNORMAL LOW (ref 3.5–5.1)
SODIUM: 132 mmol/L — AB (ref 135–145)

## 2015-10-03 MED ORDER — METRONIDAZOLE 250 MG PO TABS: 250.0000 mg | ORAL_TABLET | Freq: Three times a day (TID) | ORAL | Status: AC

## 2015-10-03 MED ORDER — CEFUROXIME AXETIL 250 MG PO TABS: 250.0000 mg | ORAL_TABLET | Freq: Two times a day (BID) | ORAL | Status: AC

## 2015-10-03 NOTE — Progress Notes (Signed)
Patient discharged.  Leaving with prescriptions.  Husband and son-in-law at bedside.  Denies pain.  Room air.  No s/s of distress.  No complaints.  No questions.

## 2015-10-04 NOTE — Discharge Summary (Signed)
Physician Discharge Summary  Jean Lam Kohlenberg ZOX:[id number] DOB: 06/08/1934 DOA: 09/30/2015  PCP: Pamelia HoitWILSON,FRED HENRY, MD  Admit date: 09/30/2015 Discharge date: 10/03/2015  Time spent: 45 minutes  Recommendations for Outpatient Follow-up:  1. Fu with PCP Dr.Wilson  in 1 week, please re-assess and possibly resume diuretics at FU 2. FU with Gi, Dr.Mann in 1 month, evaluate need for colonoscopy  Discharge Diagnoses:  Principal Problem:   Colitis/Enteritis   HYPERCHOLESTEROLEMIA   Essential hypertension   COUGH, CHRONIC   Acute renal failure superimposed on stage 3 chronic kidney disease (HCC)   Enteritis   Hyponatremia   Hypokalemia   Elevated lipase   UTI (urinary tract infection)   Pancreatitis, acute   Malnutrition of moderate degree   Discharge Condition: stable  Diet recommendation: Low sodium, Heart healthy   Filed Weights   10/01/15 0103  Weight: 61.236 kg (135 lb)    History of present illness:  Chief Complaint: Diarrhea HPI: Jean Lam Nevin is a 81 y.o. female with PMH of hypertension, hyperlipidemia, GERD, achalasia, CKD-III, chronic cough, who presented to the ER with diarrhea.Patient reported that she has been having intermittent diarrhea for about one week. Associated with nausea, but no vomiting. She has mild intermittent and diffused abdominal pain. In ED, patient was found to have WBC 27.9, lipase 131, posterior urinalysis with large amount for leukocytes, temperature normal, potassium is 3.3, sodium 126, AKI. CT abdomen/pelvis showed nonspecific stranding surrounding the descending colon,   Hospital Course:  Diarrhea and Abdomial pain -CT with mild colitis, treated with broad spectrum Abx, supportive care, IVF,  -Cdiff PCR and Blood Cx negative -WBC improved 27->22->11K -changed to Po Cefuroxime and Flagyl at discharge since she has multiple allergies, tolerated cephalosporins without problems  -clinically much improved without symptoms and tolerating diet  at discharge -FU with GI for colonoscopy down the road  Elevated Lipase -131, clinically not concerned abt pancreatitis, improving  Hyponatremia: due to diarrhea, volume depletion, diurteics -improved with hydration.  AoCKD-III: Baseline Cre is 1.19 on 09/13/10, her Cre was 2.78 on admission.  - prerenal secondary to dehydration and concomitant use of diruetics. -hydrated, creatinine improved from 2.7 on admission to 1.4 at discharge - Held HCTZ/Spironolactone due to AKI and soft BPs this admission, please re-assess need for diuretics at FU  Possible UTI (urinary tract infection): Patient is asymptomatic except for mild abdominal pain. -Follow-up blood and urine culture -changed Abx as above  Essential hypertension: -continued on Cardizem -Held spironolactone due to worsening renal function  Hypokalemia: K= 3.3 on admission. - Repleted  Discharge Exam: Filed Vitals:   10/03/15 0612  BP: 117/42  Pulse: 59  Temp: 98 F (36.7 C)  Resp: 18    General: AAOx3 Cardiovascular: S1S2/RRR Respiratory: CTAB  Discharge Instructions   Discharge Instructions    Diet - low sodium heart healthy    Complete by:  As directed      Increase activity slowly    Complete by:  As directed           Discharge Medication List as of 10/03/2015 10:57 AM    START taking these medications   Details  cefUROXime (CEFTIN) 250 MG tablet Take 1 tablet (250 mg total) by mouth 2 (two) times daily with a meal. For 4days, Starting 10/03/2015, Until Discontinued, Print    metroNIDAZOLE (FLAGYL) 250 MG tablet Take 1 tablet (250 mg total) by mouth every 8 (eight) hours. For 4 days, Starting 10/03/2015, Until Discontinued, Print      CONTINUE  these medications which have NOT CHANGED   Details  Cholecalciferol (VITAMIN D3) 2000 UNITS capsule Take 2,000 Units by mouth daily., Until Discontinued, Historical Med    diltiazem (CARDIZEM CD) 240 MG 24 hr capsule Take 240 mg by mouth daily. , Starting  11/26/2011, Until Discontinued, Historical Med    latanoprost (XALATAN) 0.005 % ophthalmic solution Place 1 drop into both eyes at bedtime., Until Discontinued, Historical Med    vitamin Lam-12 (CYANOCOBALAMIN) 1000 MCG tablet Take 1,000 mcg by mouth daily., Until Discontinued, Historical Med      STOP taking these medications     spironolactone-hydrochlorothiazide (ALDACTAZIDE) 25-25 MG per tablet      benzonatate (TESSALON) 100 MG capsule      omeprazole (PRILOSEC) 40 MG capsule        Allergies  Allergen Reactions  . Atorvastatin   . Avelox [Moxifloxacin Hcl In Nacl]   . Diovan [Valsartan]   . Doxycycline   . Levaquin [Levofloxacin In D5w]     Rash  . Metoprolol   . Niacin   . Oxycodone     Upset stomach  . Penicillins     Tolerates ceftin (received in 01/18/15 from [facility] and pt did fine per husband)  . Prednisone     redness  . Sulfonamide Derivatives    Follow-up Information    Follow up with Pamelia Hoit, MD. Schedule an appointment as soon as possible for a visit in 1 week.   Specialty:  Family Medicine   Contact information:   [address] [place] [address] [phone]        The results of significant diagnostics from this hospitalization (including imaging, microbiology, ancillary and laboratory) are listed below for reference.    Significant Diagnostic Studies: Ct Abdomen Pelvis Wo Contrast  09/30/2015  CLINICAL DATA:  Lack of appetite for 1 month. Intermittent diarrhea. EXAM: CT ABDOMEN AND PELVIS WITHOUT CONTRAST TECHNIQUE: Multidetector CT imaging of the abdomen and pelvis was performed following the standard protocol without IV contrast. COMPARISON:  None. FINDINGS: The lack of intravenous contrast limits the ability to evaluate solid abdominal organs. Normal hepatic contour. There are several punctate layering radiopaque gallstones within otherwise normal appearing gallbladder. No ascites. Note is made of a slightly exaggerated horizontal  lie of the right kidney. The left kidney appears mildly atrophic in comparison to the right. No renal stones. No renal stones are seen along the expected course of either ureter or the urinary bladder. Normal noncontrast appearance of the urinary bladder given underdistention. Several phleboliths are seen within the lower pelvis bilaterally. No urinary obstruction or perinephric stranding. There is mild thickening of the bilateral adrenal glands without discrete nodule. Normal noncontrast appearance of the pancreas and spleen. Incidental is made of several small splenules. There is ill-defined stranding surrounding the descending colon (representative images 20 and 21; coronal image 61, series 3), the etiology of which is not depicted on this examination. Scattered colonic diverticulosis seen within the sigmoid colon (representative image 57, series 2). A small amount of fluid is seen within the pelvic cul-de-sac. No definable/drainable fluid collection. Presumed postsurgical change involving the GE junction. No evidence of enteric obstruction. No pneumoperitoneum, pneumatosis or portal venous gas. Postsurgical change involving the GE junction. Moderate amount of eccentric calcified atherosclerotic plaque within a normal caliber abdominal aorta. No bulky retroperitoneal, mesenteric, pelvic or inguinal lymphadenopathy. Post hysterectomy.  No discrete adnexal lesion. Limited visualization of lower thorax demonstrates minimal subsegmental atelectasis within the image caudal segment of the  lingula. No focal airspace opacities. Normal heart size.  No pericardial effusion. No acute or aggressive osseous abnormalities. Moderate compression deformity involving the anterior aspect of the superior endplate of the L1 vertebral body. Moderate severe multilevel lumbar spine DDD, worse at L3-L4 and L4-L5 with disc space height loss, endplate irregularity and small posteriorly directed disc osteophyte complexes at these locations.  Mild presumably degenerative rotatory scoliotic curvature of the lumbar spine. Regional soft tissues appear normal. IMPRESSION: 1. Nonspecific stranding surrounding the descending colon, the etiology of which is not depicted on this examination though likely indicative of infectious or inflammatory enteritis. No definable/drainable fluid collection. No evidence of enteric obstruction. 2. Colonic diverticulosis without evidence of diverticulitis. 3. Cholelithiasis without evidence of cholecystitis. Electronically Signed   By: Simonne Come M.D.   On: 09/30/2015 21:07    Microbiology: Recent Results (from the past 240 hour(s))  Urine culture     Status: None   Collection Time: 09/30/15  8:27 PM  Result Value Ref Range Status   Specimen Description URINE, CLEAN CATCH  Final   Special Requests NONE  Final   Culture   Final    >=100,000 COLONIES/mL GROUP Lam STREP(S.AGALACTIAE)ISOLATED TESTING AGAINST S. AGALACTIAE NOT ROUTINELY PERFORMED DUE TO PREDICTABILITY OF AMP/PEN/VAN SUSCEPTIBILITY. Performed at [facility]    Report Status 10/02/2015 FINAL  Final  Culture, blood (x 2)     Status: None (Preliminary result)   Collection Time: 09/30/15 11:45 PM  Result Value Ref Range Status   Specimen Description BLOOD RIGHT ANTECUBITAL  Final   Special Requests BOTTLES DRAWN AEROBIC ONLY  Final   Culture   Final    NO GROWTH 3 DAYS Performed at [facility]    Report Status PENDING  Incomplete  Culture, blood (x 2)     Status: None (Preliminary result)   Collection Time: 09/30/15 11:50 PM  Result Value Ref Range Status   Specimen Description BLOOD BLOOD RIGHT HAND  Final   Special Requests BOTTLES DRAWN AEROBIC ONLY  Final   Culture   Final    NO GROWTH 3 DAYS Performed at [facility]    Report Status PENDING  Incomplete  C difficile quick scan w PCR reflex     Status: None   Collection Time: 10/02/15  8:30 AM  Result Value Ref Range Status   C Diff antigen  NEGATIVE NEGATIVE Final   C Diff toxin NEGATIVE NEGATIVE Final   C Diff interpretation Negative for toxigenic C. difficile  Final     Labs: Basic Metabolic Panel:  Recent Labs Lab 09/30/15 1713 10/01/15 0222 10/02/15 0610 10/03/15 0521  NA 126* 128* 131* 132*  K 3.3* 4.1 3.6 3.4*  CL 89* 98* 101 101  CO2 22 22 22 23   GLUCOSE 116* 106* 81 96  BUN 25* 25* 20 14  CREATININE 2.78* 2.48* 1.87* 1.48*  CALCIUM 8.6* 7.8* 8.1* 8.4*  MG  --  1.5*  --   --    Liver Function Tests:  Recent Labs Lab 09/30/15 1713 10/01/15 0222 10/02/15 0610  AST 29 16 15   ALT 13* 10* 9*  ALKPHOS 89 68 62  BILITOT 1.3* 0.7 0.5  PROT 7.2 5.9* 5.7*  ALBUMIN 3.6 2.8* 2.6*    Recent Labs Lab 09/30/15 1713 10/02/15 0610  LIPASE 131* 104*   No results for input(s): AMMONIA in the last 168 hours. CBC:  Recent Labs Lab 09/30/15 1713 10/01/15 0222 10/02/15 0610 10/03/15 0521  WBC 27.9*  22.5* 11.4* 8.8  HGB 12.2 9.9* 9.1* 9.2*  HCT 34.9* 28.0* 26.5* 26.6*  MCV 92.3 92.4 94.6 94.0  PLT 326 256 253 242   Cardiac Enzymes: No results for input(s): CKTOTAL, CKMB, CKMBINDEX, TROPONINI in the last 168 hours. BNP: BNP (last 3 results) No results for input(s): BNP in the last 8760 hours.  ProBNP (last 3 results) No results for input(s): PROBNP in the last 8760 hours.  CBG: No results for input(s): GLUCAP in the last 168 hours.     SignedZannie Cove  [facility] 10/04/2015, 5:39 PM

## 2015-10-05 LAB — GI PATHOGEN PANEL BY PCR, STOOL
C difficile toxin A/B: NOT DETECTED
Campylobacter by PCR: NOT DETECTED
Cryptosporidium by PCR: NOT DETECTED
E COLI (ETEC) LT/ST: NOT DETECTED
E COLI (STEC): NOT DETECTED
E coli 0157 by PCR: NOT DETECTED
G LAMBLIA BY PCR: NOT DETECTED
NOROVIRUS G1/G2: NOT DETECTED
Rotavirus A by PCR: NOT DETECTED
SALMONELLA BY PCR: NOT DETECTED
SHIGELLA BY PCR: NOT DETECTED

## 2015-10-06 LAB — CULTURE, BLOOD (ROUTINE X 2)
Culture: NO GROWTH
Culture: NO GROWTH

## 2016-02-20 IMAGING — CT CT ABD-PELV W/O CM
2 of 4 series · 15 of 46 positions shown, 17 images · non-contrast
Comparison: None.

CLINICAL DATA: Lack of appetite for 1 month. Intermittent diarrhea.

EXAM:
CT ABDOMEN AND PELVIS WITHOUT CONTRAST
TECHNIQUE: Multidetector CT imaging of the abdomen and pelvis was performed
following the standard protocol without IV contrast.

[Series 2: abd/pel w/o · axial · non-contrast · 0.83mm/px · z∈[+1420,+1750]mm · 12 of 76 slices shown, 14 images]
[im 7/76  soft-tissue]
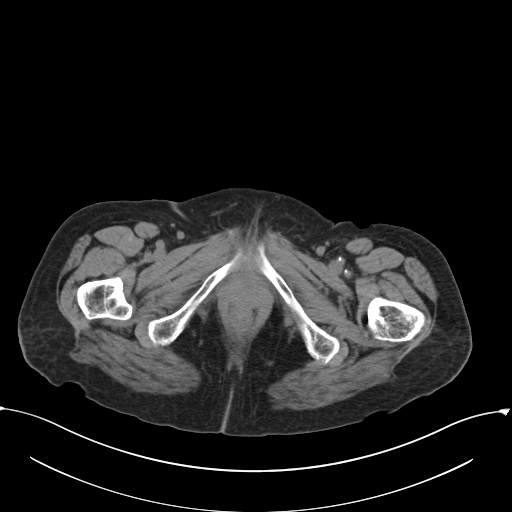
[im 7/76  bone]
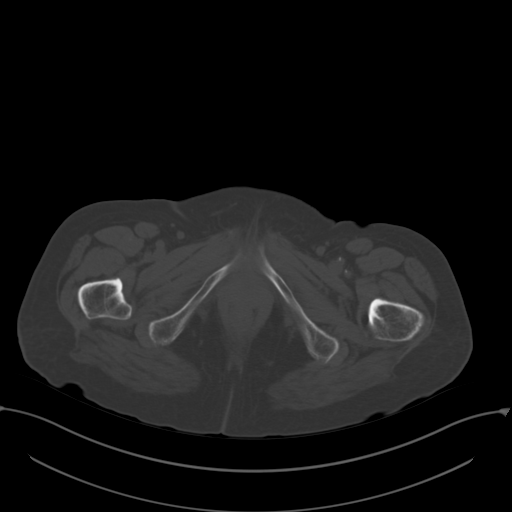
[im 13/76  soft-tissue]
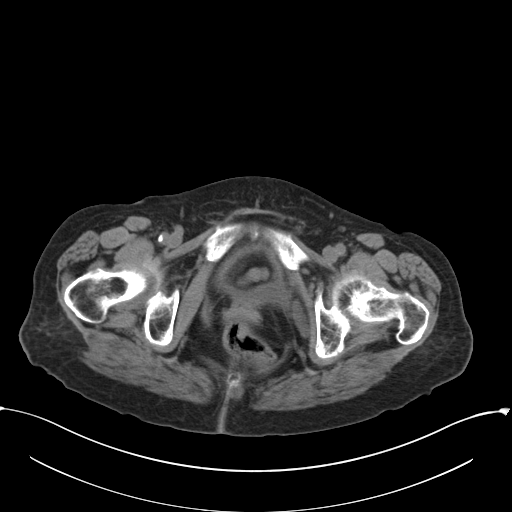
[im 19/76  soft-tissue]
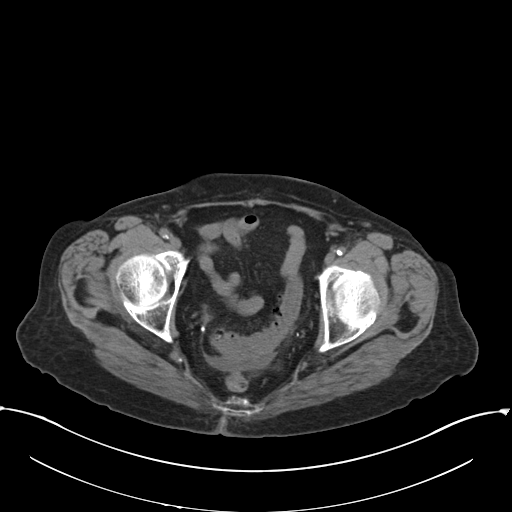
[im 25/76  soft-tissue]
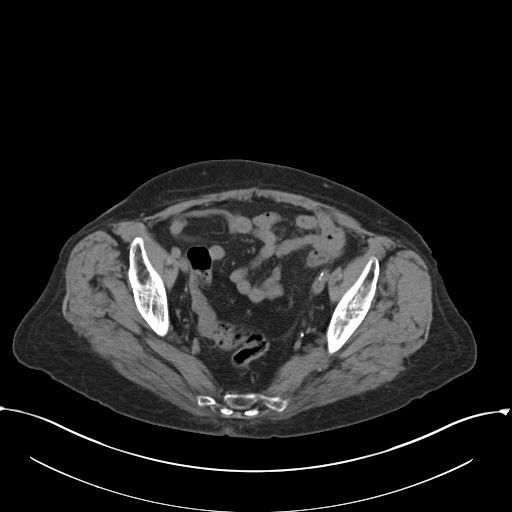
[im 31/76  soft-tissue]
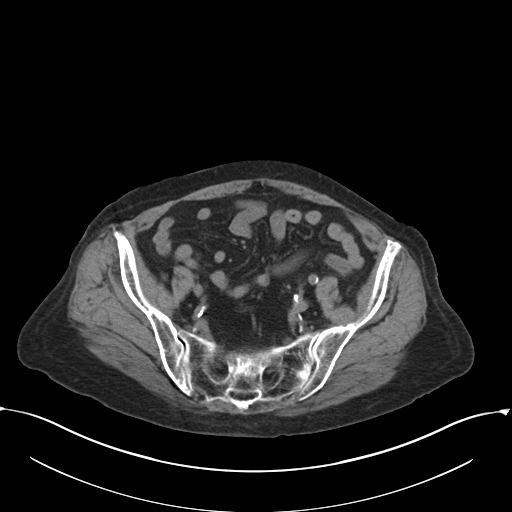
[im 37/76  soft-tissue]
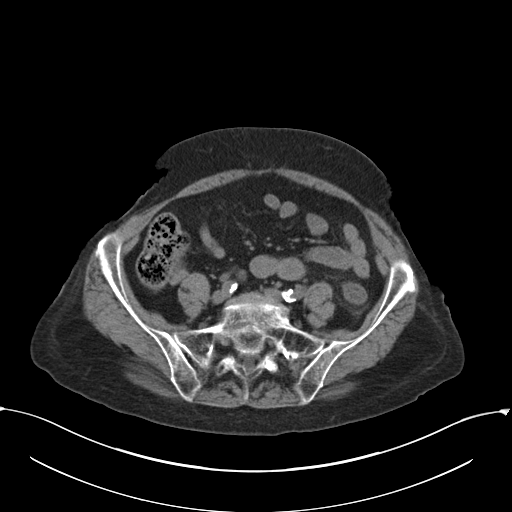
[im 43/76  soft-tissue]
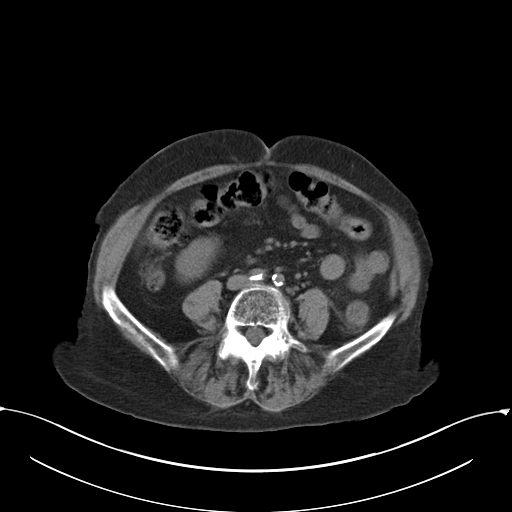
[im 49/76  soft-tissue]
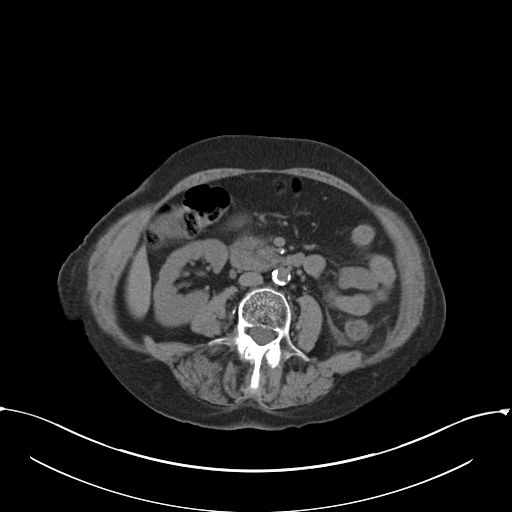
[im 55/76  soft-tissue]
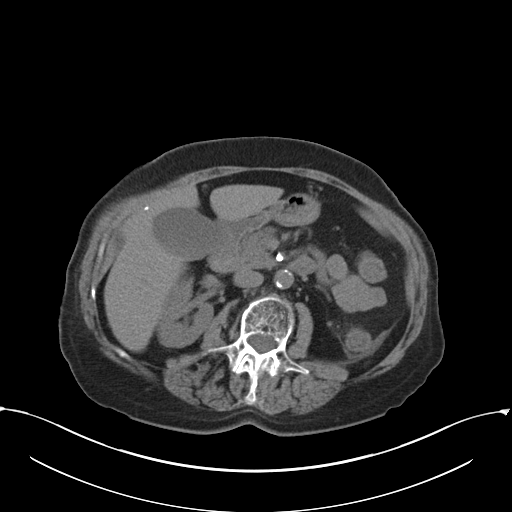
[im 55/76  bone]
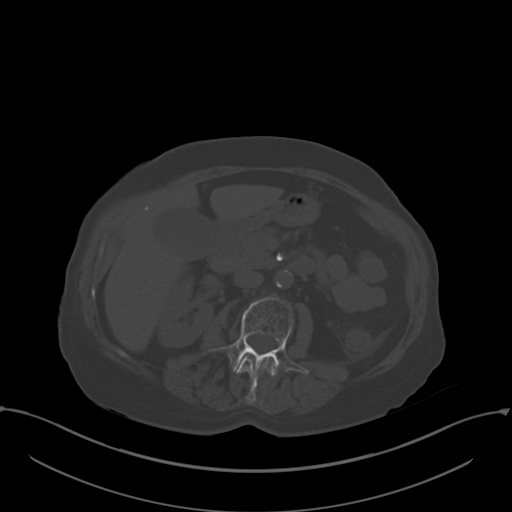
[im 61/76  soft-tissue]
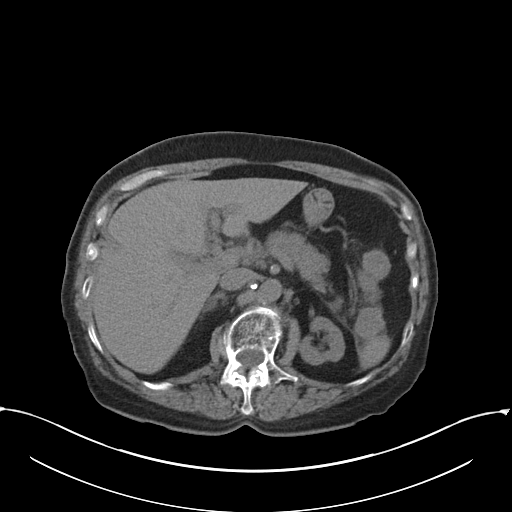
[im 67/76  soft-tissue]
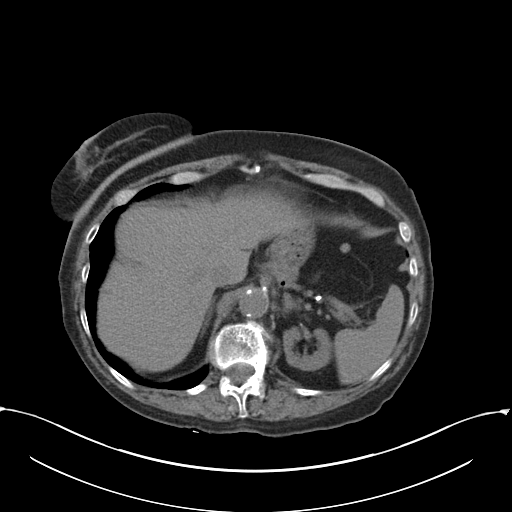
[im 73/76  soft-tissue]
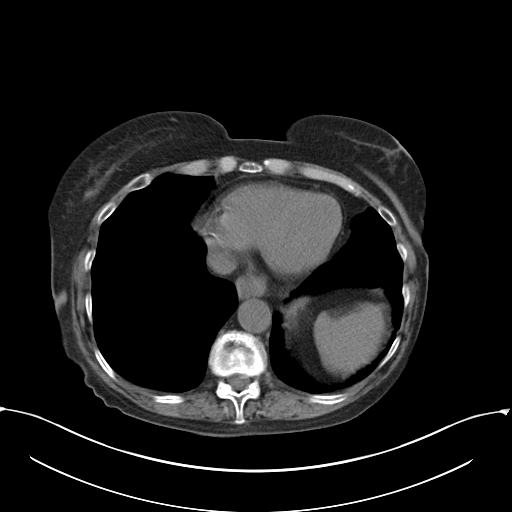

[Series 3: coronal · coronal · 0.75mm/px · 3 of 89 slices shown]
[im 30/89  soft-tissue]
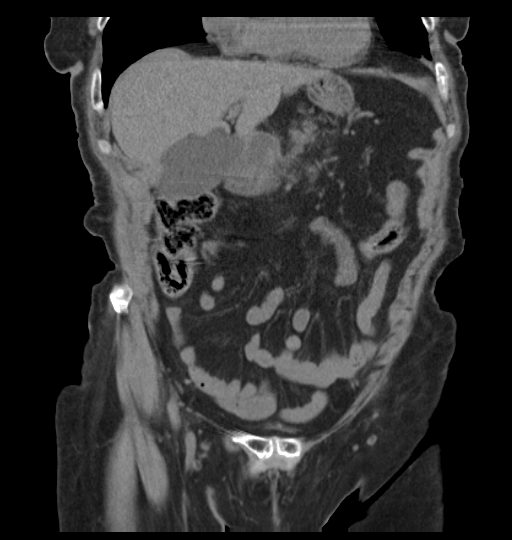
[im 40/89  soft-tissue]
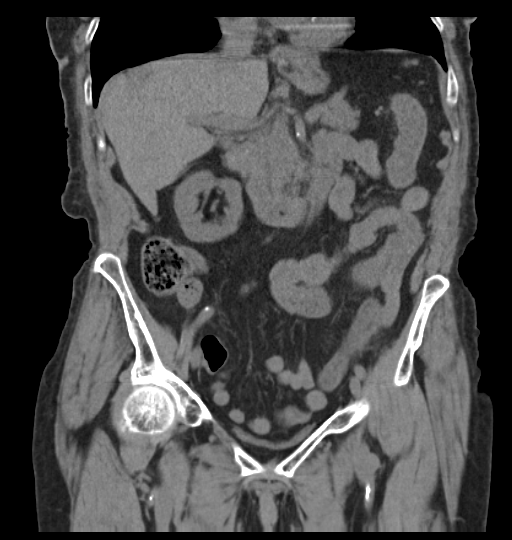
[im 49/89  soft-tissue]
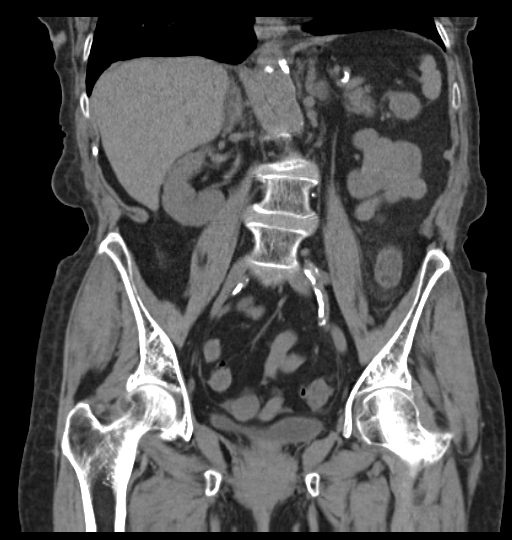

[15 of 46 positions shown; findings below may reference images not displayed]

FINDINGS: The lack of intravenous contrast limits the ability to evaluate
solid abdominal organs.

Normal hepatic contour. There are several punctate layering
radiopaque gallstones within otherwise normal appearing gallbladder.
No ascites.

Note is made of a slightly exaggerated horizontal lie of the right
kidney. The left kidney appears mildly atrophic in comparison to the
right. No renal stones. No renal stones are seen along the expected
course of either ureter or the urinary bladder. Normal noncontrast
appearance of the urinary bladder given underdistention. Several
phleboliths are seen within the lower pelvis bilaterally. No urinary
obstruction or perinephric stranding.

There is mild thickening of the bilateral adrenal glands without
discrete nodule. Normal noncontrast appearance of the pancreas and
spleen. Incidental is made of several small splenules.

There is ill-defined stranding surrounding the descending colon
(representative images 20 and 21; coronal image 61, series 3), the
etiology of which is not depicted on this examination. Scattered
colonic diverticulosis seen within the sigmoid colon (representative
image 57, series 2). A small amount of fluid is seen within the
pelvic cul-de-sac. No definable/drainable fluid collection. Presumed
postsurgical change involving the GE junction. No evidence of
enteric obstruction. No pneumoperitoneum, pneumatosis or portal
venous gas. Postsurgical change involving the GE junction.

Moderate amount of eccentric calcified atherosclerotic plaque within
a normal caliber abdominal aorta. No bulky retroperitoneal,
mesenteric, pelvic or inguinal lymphadenopathy.

Post hysterectomy.  No discrete adnexal lesion.

Limited visualization of lower thorax demonstrates minimal
subsegmental atelectasis within the image caudal segment of the
lingula. No focal airspace opacities.

Normal heart size.  No pericardial effusion.

No acute or aggressive osseous abnormalities. Moderate compression
deformity involving the anterior aspect of the superior endplate of
the L1 vertebral body. Moderate severe multilevel lumbar spine DDD,
worse at L3-L4 and L4-L5 with disc space height loss, endplate
irregularity and small posteriorly directed disc osteophyte
complexes at these locations. Mild presumably degenerative rotatory
scoliotic curvature of the lumbar spine.

Regional soft tissues appear normal.
IMPRESSION: 1. Nonspecific stranding surrounding the descending colon, the
etiology of which is not depicted on this examination though likely
indicative of infectious or inflammatory enteritis. No
definable/drainable fluid collection. No evidence of enteric
obstruction.
2. Colonic diverticulosis without evidence of diverticulitis.
3. Cholelithiasis without evidence of cholecystitis.

## 2024-07-11 DEATH — deceased
